# Patient Record
Sex: Male | Born: 1975 | Race: White | Hispanic: No | Marital: Married | Smoking: Never smoker
Health system: Southern US, Community
[De-identification: ages and names within clinical notes are randomized; demographics above are authoritative.]

## PROBLEM LIST (undated history)

## (undated) DIAGNOSIS — F419 Anxiety disorder, unspecified: Secondary | ICD-10-CM

## (undated) DIAGNOSIS — F319 Bipolar disorder, unspecified: Secondary | ICD-10-CM

## (undated) DIAGNOSIS — R51 Headache: Secondary | ICD-10-CM

## (undated) DIAGNOSIS — G8929 Other chronic pain: Secondary | ICD-10-CM

## (undated) DIAGNOSIS — K529 Noninfective gastroenteritis and colitis, unspecified: Secondary | ICD-10-CM

## (undated) DIAGNOSIS — B009 Herpesviral infection, unspecified: Secondary | ICD-10-CM

## (undated) DIAGNOSIS — I10 Essential (primary) hypertension: Secondary | ICD-10-CM

## (undated) DIAGNOSIS — M79675 Pain in left toe(s): Secondary | ICD-10-CM

## (undated) HISTORY — PX: WISDOM TOOTH EXTRACTION: SHX21

## (undated) HISTORY — DX: Other chronic pain: G89.29

## (undated) HISTORY — DX: Bipolar disorder, unspecified: F31.9

## (undated) HISTORY — DX: Herpesviral infection, unspecified: B00.9

## (undated) HISTORY — DX: Headache: R51

## (undated) HISTORY — DX: Anxiety disorder, unspecified: F41.9

## (undated) HISTORY — DX: Noninfective gastroenteritis and colitis, unspecified: K52.9

## (undated) HISTORY — DX: Essential (primary) hypertension: I10

## (undated) HISTORY — PX: HAMMER TOE SURGERY: SHX385

## (undated) HISTORY — DX: Pain in left toe(s): M79.675

---

## 2003-11-10 ENCOUNTER — Emergency Department (HOSPITAL_COMMUNITY): Admission: EM | Admit: 2003-11-10 | Discharge: 2003-11-10 | Payer: Self-pay | Admitting: Emergency Medicine

## 2004-04-09 ENCOUNTER — Ambulatory Visit: Payer: Self-pay | Admitting: Family Medicine

## 2005-10-13 ENCOUNTER — Ambulatory Visit: Payer: Self-pay | Admitting: Family Medicine

## 2005-12-15 ENCOUNTER — Ambulatory Visit: Payer: Self-pay | Admitting: Family Medicine

## 2006-04-06 ENCOUNTER — Ambulatory Visit: Payer: Self-pay | Admitting: Family Medicine

## 2006-05-04 ENCOUNTER — Ambulatory Visit: Payer: Self-pay | Admitting: Internal Medicine

## 2006-05-04 LAB — CONVERTED CEMR LAB: Tissue Transglutaminase Ab, IgA: <3 units (ref <5)

## 2007-03-25 ENCOUNTER — Ambulatory Visit: Payer: Self-pay | Admitting: Family Medicine

## 2007-03-25 DIAGNOSIS — R519 Headache, unspecified: Secondary | ICD-10-CM | POA: Insufficient documentation

## 2007-03-25 DIAGNOSIS — R51 Headache: Secondary | ICD-10-CM | POA: Insufficient documentation

## 2007-03-25 DIAGNOSIS — R209 Unspecified disturbances of skin sensation: Secondary | ICD-10-CM

## 2007-03-25 LAB — CONVERTED CEMR LAB
Blood in Urine, dipstick: NEGATIVE
Ketones, urine, test strip: NEGATIVE
Nitrite: NEGATIVE
Specific Gravity, Urine: 1.015
Urobilinogen, UA: 0.2
pH: 6.5

## 2007-03-26 ENCOUNTER — Telehealth: Payer: Self-pay | Admitting: Family Medicine

## 2007-03-26 LAB — CONVERTED CEMR LAB
Basophils Absolute: 0 10*3/uL (ref 0.0–0.1)
Chloride: 104 meq/L (ref 96–112)
Eosinophils Relative: 1.3 % (ref 0.0–5.0)
GFR calc Af Amer: 90 mL/min
Hemoglobin: 14.4 g/dL (ref 13.0–17.0)
MCHC: 34.2 g/dL (ref 30.0–36.0)
Monocytes Relative: 8.9 % (ref 3.0–11.0)
Neutrophils Relative %: 56 % (ref 43.0–77.0)
Platelets: 228 10*3/uL (ref 150–400)
RBC: 4.91 M/uL (ref 4.22–5.81)
RDW: 12.1 % (ref 11.5–14.6)
Sodium: 140 meq/L (ref 135–145)
WBC: 4.8 10*3/uL (ref 4.5–10.5)

## 2007-03-30 ENCOUNTER — Ambulatory Visit: Payer: Self-pay | Admitting: Family Medicine

## 2007-03-31 DIAGNOSIS — E875 Hyperkalemia: Secondary | ICD-10-CM | POA: Insufficient documentation

## 2007-03-31 LAB — CONVERTED CEMR LAB: Potassium: 5.8 meq/L — ABNORMAL HIGH (ref 3.5–5.1)

## 2007-04-12 ENCOUNTER — Telehealth: Payer: Self-pay | Admitting: Family Medicine

## 2007-04-17 ENCOUNTER — Encounter: Payer: Self-pay | Admitting: Family Medicine

## 2007-04-20 LAB — CONVERTED CEMR LAB
BUN: 13 mg/dL (ref 6–23)
CO2: 27 meq/L (ref 19–32)
Chloride: 102 meq/L (ref 96–112)
Creatinine, Ser: 1.08 mg/dL (ref 0.40–1.50)
Glucose, Bld: 96 mg/dL (ref 70–99)
Sodium: 136 meq/L (ref 135–145)

## 2007-05-10 ENCOUNTER — Telehealth: Payer: Self-pay | Admitting: Family Medicine

## 2007-07-10 ENCOUNTER — Telehealth: Payer: Self-pay | Admitting: Family Medicine

## 2007-07-10 ENCOUNTER — Ambulatory Visit: Payer: Self-pay | Admitting: Family Medicine

## 2007-07-13 ENCOUNTER — Ambulatory Visit: Payer: Self-pay | Admitting: Family Medicine

## 2007-07-21 ENCOUNTER — Telehealth: Payer: Self-pay | Admitting: Family Medicine

## 2007-12-07 ENCOUNTER — Ambulatory Visit: Payer: Self-pay | Admitting: Family Medicine

## 2007-12-07 DIAGNOSIS — M546 Pain in thoracic spine: Secondary | ICD-10-CM

## 2007-12-07 LAB — CONVERTED CEMR LAB
Bilirubin Urine: NEGATIVE
Blood in Urine, dipstick: NEGATIVE
Glucose, Urine, Semiquant: NEGATIVE
Protein, U semiquant: NEGATIVE
Specific Gravity, Urine: <1.005
Urobilinogen, UA: 0.2

## 2007-12-13 ENCOUNTER — Ambulatory Visit: Payer: Self-pay | Admitting: Cardiovascular Disease

## 2007-12-13 ENCOUNTER — Telehealth: Payer: Self-pay | Admitting: Family Medicine

## 2008-08-08 ENCOUNTER — Encounter: Admission: RE | Admit: 2008-08-08 | Discharge: 2008-08-08 | Payer: Self-pay | Admitting: Internal Medicine

## 2008-12-13 ENCOUNTER — Ambulatory Visit: Payer: Self-pay | Admitting: Family Medicine

## 2008-12-13 DIAGNOSIS — G589 Mononeuropathy, unspecified: Secondary | ICD-10-CM | POA: Insufficient documentation

## 2009-03-02 ENCOUNTER — Ambulatory Visit: Payer: Self-pay | Admitting: Family Medicine

## 2009-03-02 LAB — CONVERTED CEMR LAB
Bilirubin Urine: NEGATIVE
Blood in Urine, dipstick: NEGATIVE
Glucose, Urine, Semiquant: NEGATIVE
Nitrite: NEGATIVE
Specific Gravity, Urine: 1.02
Urobilinogen, UA: 0.2
WBC Urine, dipstick: NEGATIVE
pH: 7

## 2009-03-05 LAB — CONVERTED CEMR LAB
ALT: 21 units/L (ref 0–53)
AST: 16 units/L (ref 0–37)
Albumin: 4.3 g/dL (ref 3.5–5.2)
Chloride: 103 meq/L (ref 96–112)
Eosinophils Relative: 1.3 % (ref 0.0–5.0)
GFR calc non Af Amer: 80.71 mL/min (ref >60)
Glucose, Bld: 99 mg/dL (ref 70–99)
HCT: 45.8 % (ref 39.0–52.0)
Hemoglobin: 15.2 g/dL (ref 13.0–17.0)
LDL Cholesterol: 92 mg/dL (ref 0–99)
Lymphs Abs: 1.1 10*3/uL (ref 0.7–4.0)
MCV: 88.1 fL (ref 78.0–100.0)
Monocytes Absolute: 0.3 10*3/uL (ref 0.1–1.0)
Monocytes Relative: 8.3 % (ref 3.0–12.0)
Neutro Abs: 2.6 10*3/uL (ref 1.4–7.7)
Potassium: 5 meq/L (ref 3.5–5.1)
Sodium: 138 meq/L (ref 135–145)
TSH: 1.74 microintl units/mL (ref 0.35–5.50)
Total Protein: 7.5 g/dL (ref 6.0–8.3)
VLDL: 12.4 mg/dL (ref 0.0–40.0)
WBC: 4.1 10*3/uL — ABNORMAL LOW (ref 4.5–10.5)

## 2010-02-06 ENCOUNTER — Telehealth: Payer: Self-pay | Admitting: Family Medicine

## 2010-02-12 NOTE — Assessment & Plan Note (Signed)
Summary: CPX/PT FASTING/RCD/pt rescd//ccm   Vital Signs:  Patient profile:   35 year old male Height:      75 inches Weight:      254 pounds Temp:     98.2 degrees F oral Pulse rate:   74 / minute BP sitting:   132 / 70  (left arm) Cuff size:   large  Vitals Entered By: Alfred Levins, CMA (March 02, 2009 9:29 AM) CC: cpx, fasting   History of Present Illness: 35 yr old male for cpx. he is doing well. he only has several bad migraines a year. usually imitrex works well.   Preventive Screening-Counseling & Management  Alcohol-Tobacco     Smoking Status: never  Current Medications (verified): 1)  Vicodin 5-500 Mg  Tabs (Hydrocodone-Acetaminophen) .Marland Kitchen.. 1 Every 6 Hours As Needed For Pain 2)  Imitrex 100 Mg Tabs (Sumatriptan Succinate) .... As Needed 3)  Promethazine Hcl 25 Mg  Tabs (Promethazine Hcl) .Marland Kitchen.. 1 Every 4 Hours As Needed For Nausea  Allergies (verified): No Known Drug Allergies  Past History:  Past Medical History: Reviewed history from 03/25/2007 and no changes required. Headache IBS  Past Surgical History: Reviewed history from 03/25/2007 and no changes required. colonoscopy 09-08-03,  normal  Family History: Reviewed history from 03/25/2007 and no changes required. Family History of Arthritis Non-Hodgkins lymphoma  Social History: Reviewed history from 03/25/2007 and no changes required. Married Never Smoked Alcohol use-yes Drug use-no Regular exercise-no  Review of Systems  The patient denies anorexia, fever, weight loss, weight gain, vision loss, decreased hearing, hoarseness, chest pain, syncope, dyspnea on exertion, peripheral edema, prolonged cough, headaches, hemoptysis, abdominal pain, melena, hematochezia, severe indigestion/heartburn, hematuria, incontinence, genital sores, muscle weakness, suspicious skin lesions, transient blindness, difficulty walking, depression, unusual weight change, abnormal bleeding, enlarged lymph nodes,  angioedema, breast masses, and testicular masses.    Physical Exam  General:  Well-developed,well-nourished,in no acute distress; alert,appropriate and cooperative throughout examination Head:  Normocephalic and atraumatic without obvious abnormalities. No apparent alopecia or balding. Eyes:  No corneal or conjunctival inflammation noted. EOMI. Perrla. Funduscopic exam benign, without hemorrhages, exudates or papilledema. Vision grossly normal. Ears:  External ear exam shows no significant lesions or deformities.  Otoscopic examination reveals clear canals, tympanic membranes are intact bilaterally without bulging, retraction, inflammation or discharge. Hearing is grossly normal bilaterally. Nose:  External nasal examination shows no deformity or inflammation. Nasal mucosa are pink and moist without lesions or exudates. Mouth:  Oral mucosa and oropharynx without lesions or exudates.  Teeth in good repair. Neck:  No deformities, masses, or tenderness noted. Chest Wall:  No deformities, masses, tenderness or gynecomastia noted. Lungs:  Normal respiratory effort, chest expands symmetrically. Lungs are clear to auscultation, no crackles or wheezes. Heart:  Normal rate and regular rhythm. S1 and S2 normal without gallop, murmur, click, rub or other extra sounds. Abdomen:  Bowel sounds positive,abdomen soft and non-tender without masses, organomegaly or hernias noted. Genitalia:  Testes bilaterally descended without nodularity, tenderness or masses. No scrotal masses or lesions. No penis lesions or urethral discharge. Msk:  No deformity or scoliosis noted of thoracic or lumbar spine.   Pulses:  R and L carotid,radial,femoral,dorsalis pedis and posterior tibial pulses are full and equal bilaterally Extremities:  No clubbing, cyanosis, edema, or deformity noted with normal full range of motion of all joints.   Neurologic:  No cranial nerve deficits noted. Station and gait are normal. Plantar reflexes are  down-going bilaterally. DTRs are symmetrical throughout. Sensory, motor and  coordinative functions appear intact. Skin:  Intact without suspicious lesions or rashes Cervical Nodes:  No lymphadenopathy noted Axillary Nodes:  No palpable lymphadenopathy Inguinal Nodes:  No significant adenopathy Psych:  Cognition and judgment appear intact. Alert and cooperative with normal attention span and concentration. No apparent delusions, illusions, hallucinations   Impression & Recommendations:  Problem # 1:  HEALTH SCREENING (ICD-V70.0)  Orders: Venipuncture (82956) TLB-Lipid Panel (80061-LIPID) TLB-BMP (Basic Metabolic Panel-BMET) (80048-METABOL) TLB-CBC Platelet - w/Differential (85025-CBCD) TLB-Hepatic/Liver Function Pnl (80076-HEPATIC) TLB-TSH (Thyroid Stimulating Hormone) (84443-TSH) UA Dipstick w/o Micro (manual) (21308)  Complete Medication List: 1)  Vicodin 5-500 Mg Tabs (Hydrocodone-acetaminophen) .Marland Kitchen.. 1 every 6 hours as needed for pain 2)  Imitrex 100 Mg Tabs (Sumatriptan succinate) .... As needed 3)  Promethazine Hcl 25 Mg Tabs (Promethazine hcl) .Marland Kitchen.. 1 every 4 hours as needed for nausea  Patient Instructions: 1)  Please schedule a follow-up appointment in 1 year.  Prescriptions: PROMETHAZINE HCL 25 MG  TABS (PROMETHAZINE HCL) 1 every 4 hours as needed for nausea  #60 x 2   Entered and Authorized by:   Nelwyn Salisbury MD   Signed by:   Nelwyn Salisbury MD on 03/02/2009   Method used:   Print then Give to Patient   RxID:   [id number] IMITREX 100 MG TABS (SUMATRIPTAN SUCCINATE) as needed  #12 x 11   Entered and Authorized by:   Nelwyn Salisbury MD   Signed by:   Nelwyn Salisbury MD on 03/02/2009   Method used:   Print then Give to Patient   RxID:   [id number] VICODIN 5-500 MG  TABS (HYDROCODONE-ACETAMINOPHEN) 1 every 6 hours as needed for pain  #60 x 2   Entered and Authorized by:   Nelwyn Salisbury MD   Signed by:   Nelwyn Salisbury MD on 03/02/2009   Method used:   Print then  Give to Patient   RxID:   [id number]   Laboratory Results   Urine Tests  Date/Time Recieved: March 02, 2009 11:57 AM  Date/Time Reported: March 02, 2009 11:57 AM   Routine Urinalysis   Color: yellow Appearance: Clear Glucose: negative   (Normal Range: Negative) Bilirubin: negative   (Normal Range: Negative) Ketone: negative   (Normal Range: Negative) Spec. Gravity: 1.020   (Normal Range: 1.003-1.035) Blood: negative   (Normal Range: Negative) pH: 7.0   (Normal Range: 5.0-8.0) Protein: negative   (Normal Range: Negative) Urobilinogen: 0.2   (Normal Range: 0-1) Nitrite: negative   (Normal Range: Negative) Leukocyte Esterace: negative   (Normal Range: Negative)    Comments: Wynona Canes, CMA  March 02, 2009 11:57 AM

## 2010-02-14 NOTE — Progress Notes (Signed)
Summary: refills  Phone Note Call from Patient   Caller: Spouse Call For: Nelwyn Salisbury MD Summary of Call: Wife states he needs a refill of Imitrex, Phenergan, and Vicodin. Appt for CPX in 03/[phone]  ASAP, please do this TONIGHT [facility] [address] Initial call taken by: [facility] CMA AAMA,  February 06, 2010 2:02 PM  Follow-up for Phone Call        call in Imitrex #12 with 2 rf, Vicodin #60 with no rf, and Phenergan #60 with no rf Follow-up by: Nelwyn Salisbury MD,  February 06, 2010 4:26 PM  Additional Follow-up for Phone Call Additional follow up Details #1::        pt aware.  Additional Follow-up by: Pura Spice, RN,  February 06, 2010 5:17 PM    Prescriptions: PROMETHAZINE HCL 25 MG  TABS (PROMETHAZINE HCL) 1 every 4 hours as needed for nausea  #60 x 0   Entered by:   Pura Spice, RN   Authorized by:   Nelwyn Salisbury MD   Signed by:   Pura Spice, RN on 02/06/2010   Method used:   Telephoned to ...       [facility] [address] # [phone]* (retail)       [address] [place], [place]  [address]       Ph: [phone]       Fax: [phone]   RxID:   [id number] IMITREX 100 MG TABS (SUMATRIPTAN SUCCINATE) as needed  #12 x 0   Entered by:   Pura Spice, RN   Authorized by:   Nelwyn Salisbury MD   Signed by:   Pura Spice, RN on 02/06/2010   Method used:   Telephoned to ...       [facility] [address] # [phone]* (retail)       [address] [place], [place]  [address]       Ph: [phone]       Fax: [phone]   RxID:   [id number] VICODIN 5-500 MG  TABS (HYDROCODONE-ACETAMINOPHEN) 1 every 6 hours as needed for pain  #60 x 0   Entered by:   Pura Spice, RN   Authorized by:   Nelwyn Salisbury MD   Signed by:   Pura Spice, RN on 02/06/2010   Method used:   Telephoned to ...       [facility] [address] [facility] [address]* (retail)       [address] [place], [place]  [address]       Ph: [phone]       Fax: [phone]   RxID:    [id number]

## 2010-03-05 ENCOUNTER — Encounter: Payer: Self-pay | Admitting: Family Medicine

## 2010-03-07 ENCOUNTER — Encounter: Payer: Self-pay | Admitting: Family Medicine

## 2010-03-07 ENCOUNTER — Ambulatory Visit (INDEPENDENT_AMBULATORY_CARE_PROVIDER_SITE_OTHER): Payer: BC Managed Care – PPO | Admitting: Family Medicine

## 2010-03-07 VITALS — BP 120/86 | HR 87 | Wt 249.0 lb

## 2010-03-07 DIAGNOSIS — G4700 Insomnia, unspecified: Secondary | ICD-10-CM

## 2010-03-07 DIAGNOSIS — F419 Anxiety disorder, unspecified: Secondary | ICD-10-CM

## 2010-03-07 DIAGNOSIS — F411 Generalized anxiety disorder: Secondary | ICD-10-CM

## 2010-03-07 MED ORDER — LORAZEPAM 1 MG PO TABS: 1.0000 mg | ORAL_TABLET | Freq: Every evening | ORAL | Status: AC | PRN

## 2010-03-07 NOTE — Progress Notes (Signed)
  Subjective:    Patient ID: Samuel Harrell, male    DOB: [birth year], 35 y.o.   MRN: [id number]  HPI Here with his wife to discuss some episodes of anxiety which wake him up from sleep in the middle of the night. He has been under some stress lately, and when a close friend recently died at age 39, he has been very worried about his health. He wakes up once or twice a week "feeling funny" in his chest, but he cannot describe this with any more detail. No chest pain or palpitations or SOB. No heartburn or GERD symptoms. When he wakes up he lies in bed worrying that something is wrong with him. He denies any anxiety during the day, no depression.    Review of Systems  Constitutional: Negative.   Respiratory: Negative.   Cardiovascular: Negative.   Gastrointestinal: Negative.   Psychiatric/Behavioral: Positive for sleep disturbance. Negative for dysphoric mood and agitation. The patient is nervous/anxious.        Objective:   Physical Exam  Constitutional: He appears well-developed and well-nourished. No distress.  Cardiovascular: Normal rate, regular rhythm, normal heart sounds and intact distal pulses.  Exam reveals no gallop and no friction rub.   No murmur heard. Pulmonary/Chest: Effort normal and breath sounds normal. No respiratory distress. He has no wheezes. He has no rales. He exhibits no tenderness.  Psychiatric: He has a normal mood and affect. His behavior is normal. Judgment and thought content normal.          Assessment & Plan:  These seem to be a a manifestation of anxiety. I reassured him that he is fine physically and that he has no risk factors for heart disease. We will try a mild medication at bedtime for anxiety for a month or so, and go from there.

## 2010-04-09 ENCOUNTER — Encounter: Payer: Self-pay | Admitting: Family Medicine

## 2010-04-30 ENCOUNTER — Ambulatory Visit (INDEPENDENT_AMBULATORY_CARE_PROVIDER_SITE_OTHER): Payer: BC Managed Care – PPO | Admitting: Family Medicine

## 2010-04-30 ENCOUNTER — Encounter: Payer: Self-pay | Admitting: Family Medicine

## 2010-04-30 VITALS — BP 120/86 | HR 100 | Temp 98.7°F | Wt 244.0 lb

## 2010-04-30 DIAGNOSIS — F411 Generalized anxiety disorder: Secondary | ICD-10-CM

## 2010-04-30 DIAGNOSIS — K219 Gastro-esophageal reflux disease without esophagitis: Secondary | ICD-10-CM

## 2010-04-30 DIAGNOSIS — F419 Anxiety disorder, unspecified: Secondary | ICD-10-CM

## 2010-04-30 MED ORDER — LORAZEPAM 1 MG PO TABS: 1.0000 mg | ORAL_TABLET | Freq: Every evening | ORAL | Status: DC | PRN

## 2010-04-30 MED ORDER — OMEPRAZOLE 40 MG PO CPDR: 40.0000 mg | DELAYED_RELEASE_CAPSULE | Freq: Every day | ORAL | Status: DC

## 2010-04-30 NOTE — Progress Notes (Signed)
  Subjective:    Patient ID: Samuel Harrell, male    DOB: [birth year], 35 y.o.   MRN: [id number]  HPI Here for several weeks of waking up at night with chest discomfort, SOB, and lots of anxiety. He still takes one Ativan at bedtime every night. He has had some heartburn and belching lately, especially when eating spicy foods like pizza. He had a lot of this over the past weekend, and Pepto-Bismol helped it. No nausea. BMs are regular.    Review of Systems  Constitutional: Negative.   Respiratory: Positive for chest tightness and shortness of breath. Negative for cough and wheezing.   Cardiovascular: Positive for chest pain. Negative for palpitations and leg swelling.  Gastrointestinal: Negative.   Psychiatric/Behavioral: Positive for sleep disturbance. Negative for dysphoric mood and agitation. The patient is nervous/anxious.        Objective:   Physical Exam  Constitutional: He appears well-developed and well-nourished.  Cardiovascular: Normal rate, regular rhythm, normal heart sounds and intact distal pulses.   Pulmonary/Chest: Effort normal and breath sounds normal.  Abdominal: Soft. Bowel sounds are normal. He exhibits no distension and no mass. There is no tenderness. There is no rebound and no guarding.  Psychiatric: He has a normal mood and affect. His behavior is normal. Judgment and thought content normal.          Assessment & Plan:  He has classic nocturnal GERD symptoms, and these are causing an anxiety reaction in the middle of the night. Avoid large meals and spicy foods late at night. Start on Omeprazole daily. Continue bedtime Ativan.

## 2010-05-31 NOTE — Assessment & Plan Note (Signed)
[facility]                         GASTROENTEROLOGY OFFICE NOTE   NAME:Samuel Harrell, Thon                       MRN:          [id number]  DATE:05/04/2006                            DOB:          05-29-1975    REFERRING PHYSICIAN:  Tera Mater. Clent Ridges, MD   OFFICE CONSULTATION NOTE:   REASON FOR CONSULTATION:  Stomach problems.   HISTORY:  This is a 35 year old white male with presumed chronic  irritable bowel syndrome who was referred through the courtesy of Dr.  Clent Ridges regarding abdominal complaints.  The patient reports a many-year  history of problems with intermittent urgency and diarrhea, often  exacerbated by meals.  This alternating with constipation and bloating.  Diarrhea seems to predominate.  For diarrhea, he will take Imodium.  For  constipation, he will take fiber.  The patient's mother has a history of  ulcerative colitis.  The patient, himself, was sent directly for  colonoscopy on September 08, 2003 to evaluate chronic diarrhea and  hematochezia.  The examination, including intubation of the ileum, was  entirely normal.  He presents at this time after reporting 2 episodes of  flu-like symptoms followed by several days of severe diarrhea.  He  occasionally has some minor blood on the tissue which has been  attributed to a fissure.  He has no problems with his appetite.  His  weight is stable.  No nocturnal symptoms.  No melena, fevers or other  symptoms.  He also has occasional heartburn for which he takes Tums.  The patient denies any extraintestinal problems such as skin rash, joint  aches or visual disturbance.   PAST MEDICAL HISTORY:  None.   PAST SURGICAL HISTORY:  None.   ALLERGIES:  None.   CURRENT MEDICATIONS:  None.   FAMILY HISTORY:  Mother with ulcerative colitis.   SOCIAL HISTORY:  The patient is married with 1 daughter.  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  He does not smoke.  Rarely uses  alcohol.   REVIEW OF  SYSTEMS:  Per diagnostic evaluation form.   PHYSICAL EXAMINATION:  GENERAL:  A well-appearing male in no acute  distress.  VITAL SIGNS:  Blood pressure is 144/74, heart rate 76.  Weight is 234  pounds.  He is 6 feet, 4 inches in height.  HEENT:  Sclerae anicteric.  Conjunctivae are pink.  Oral mucosa intact.  No adenopathy.  LUNGS:  Clear.  HEART:  Regular.  ABDOMEN:  Soft without tenderness, mass or hernia.  Good bowel sounds  heard.  RECTAL:  Omitted.  EXTREMITIES:  Without edema.   IMPRESSION:  Chronic abdominal complaints, most consistent with  irritable bowel syndrome.  No worrisome features by history.  Negative  colonoscopy in 2005.   RECOMMENDATIONS:  1. Literature provided on irritable bowel syndrome.  2. Obtain tissue transglutaminase antibody to screen for occult sprue.  3. Provide Lomotil for episodes of severe diarrhea.  The patient      requests something stronger than Imodium.  4. GI follow up p.r.n.  Ongoing general medical care with Dr. Clent Ridges.  Wilhemina Bonito. Marina Goodell, MD  Electronically Signed    JNP/MedQ  DD: 05/04/2006  DT: 05/05/2006  Job #: [id number]   cc:   Jeannett Senior A. Clent Ridges, MD

## 2010-05-31 NOTE — Assessment & Plan Note (Signed)
[facility] OFFICE NOTE   NAME:Bevans, Samuel Harrell                       MRN:          [id number]  DATE:10/13/2005                            DOB:          February 20, 1975    This is a 35 year old gentleman here for a complete physical examination.  He feels fine and has no complaints at all.  We have diagnosed him with  irritable bowel syndrome in the past.  He occasionally takes Imodium AD for  that but primarily he does quite well simply by managing his diet.  His  migraine headaches are quite stable.  In fact, he only averages one or two a  year.  For other details of his past medical history, family history, social  history, habits, etc., refer to our last physical note dated February 21, 2002.   ALLERGIES:  None.   CURRENT MEDICATIONS:  1. Imitrex 100 mg tablets as needed.  2. Imodium AD as needed.   OBJECTIVE:  Height 6 foot 4 inches.  Weight 236.  BP 132/94 (on recheck it  was down to 126/88), pulse 76 and regular.  GENERAL:  He appears to be quite healthy.  SKIN:  Clear.  EYES:  Clear.  EARS:  Clear.  PHARYNX:  Clear.  NECK:  Supple without lymphadenopathy, masses.  LUNGS:  Clear.  CARDIAC:  Rate and rhythm regular without gallops, murmurs or rubs.  Distal  pulses are full.  ABDOMEN:  Soft.  Normal bowel sounds.  Nontender.  No masses.  EXTREMITIES:  No clubbing, cyanosis, or edema.  NEUROLOGICAL:  Grossly intact.  GENITALIA:  Normal male.  EXTREMITIES:  No clubbing, cyanosis, or edema.   ASSESSMENT/PLAN:  1. Complete physical:  He is fasting so we will get the usual      laboratories.  2. Irritable bowel syndrome, stable.  3. Migraine headaches, stable.            ______________________________  Tera Mater. Clent Ridges, MD   SAF/MedQ  DD:  10/14/2005  DT:  10/16/2005  Job #:  [id number]

## 2010-06-16 ENCOUNTER — Other Ambulatory Visit: Payer: Self-pay | Admitting: Family Medicine

## 2010-06-17 NOTE — Telephone Encounter (Signed)
Call in #30 with 5 rf 

## 2010-06-17 NOTE — Telephone Encounter (Signed)
Rx call into [facility] pharmacist [place]

## 2010-06-20 ENCOUNTER — Telehealth: Payer: Self-pay | Admitting: *Deleted

## 2010-06-20 NOTE — Telephone Encounter (Signed)
Call in #30 with 5 rf 

## 2010-06-20 NOTE — Telephone Encounter (Signed)
Refill on lorazepam 1 mg

## 2010-06-21 MED ORDER — LORAZEPAM 1 MG PO TABS: 1.0000 mg | ORAL_TABLET | Freq: Every day | ORAL | Status: DC

## 2010-06-21 NOTE — Telephone Encounter (Signed)
rx called in

## 2010-08-07 ENCOUNTER — Telehealth: Payer: Self-pay

## 2010-08-07 NOTE — Telephone Encounter (Signed)
I called and left message. I need to make sure that pt is requesting this and then Dr. Clent Ridges said he would make the referral.

## 2010-08-07 NOTE — Telephone Encounter (Signed)
Pt's wife would like to discuss urology referral for pt to get a vasectomy

## 2010-10-03 ENCOUNTER — Ambulatory Visit (INDEPENDENT_AMBULATORY_CARE_PROVIDER_SITE_OTHER): Payer: BC Managed Care – PPO | Admitting: Internal Medicine

## 2010-10-03 ENCOUNTER — Encounter: Payer: Self-pay | Admitting: Internal Medicine

## 2010-10-03 VITALS — BP 160/90 | Temp 98.1°F | Wt 250.0 lb

## 2010-10-03 DIAGNOSIS — Z23 Encounter for immunization: Secondary | ICD-10-CM

## 2010-10-03 DIAGNOSIS — R51 Headache: Secondary | ICD-10-CM

## 2010-10-03 DIAGNOSIS — Z0000 Encounter for general adult medical examination without abnormal findings: Secondary | ICD-10-CM

## 2010-10-03 DIAGNOSIS — R42 Dizziness and giddiness: Secondary | ICD-10-CM

## 2010-10-03 NOTE — Progress Notes (Signed)
  Subjective:    Patient ID: Samuel Harrell, male    DOB: 28-Feb-1975, 35 y.o.   MRN: [id number]  HPI  35 year old patient who presents with a one-week history of intermittent lightheadedness. This has worsened in the past few days he describes a sense of being in lightheaded but does seem to be aggravated by movement denies true vertigo. Symptoms are intermittent and describes as a lightheadedness and fullness in the head area for the past couple days she has had some upper back and neck discomfort. No URI or allergy symptoms. Symptoms have been worse over the past 6 days. He does have a history of migraine headaches which have been stable;  he has been on lorazepam to assist with sleep which he stopped several days ago due to concerns of a possible drug side effect    Review of Systems  Constitutional: Negative for fever, chills, appetite change and fatigue.  HENT: Negative for hearing loss, ear pain, congestion, sore throat, trouble swallowing, neck stiffness, dental problem, voice change and tinnitus.   Eyes: Negative for pain, discharge and visual disturbance.  Respiratory: Negative for cough, chest tightness, wheezing and stridor.   Cardiovascular: Negative for chest pain, palpitations and leg swelling.  Gastrointestinal: Negative for nausea, vomiting, abdominal pain, diarrhea, constipation, blood in stool and abdominal distention.  Genitourinary: Negative for urgency, hematuria, flank pain, discharge, difficulty urinating and genital sores.  Musculoskeletal: Negative for myalgias, back pain, joint swelling, arthralgias and gait problem.  Skin: Negative for rash.  Neurological: Positive for light-headedness. Negative for dizziness, syncope, speech difficulty, weakness, numbness and headaches.  Hematological: Negative for adenopathy. Does not bruise/bleed easily.  Psychiatric/Behavioral: Negative for behavioral problems and dysphoric mood. The patient is not nervous/anxious.          Objective:   Physical Exam  Constitutional: He is oriented to person, place, and time. He appears well-developed.  HENT:  Head: Normocephalic.  Right Ear: External ear normal.  Left Ear: External ear normal.       Slight dullness of the left tympanic membrane  Eyes: Conjunctivae and EOM are normal.  Neck: Normal range of motion.  Cardiovascular: Normal rate and normal heart sounds.   Pulmonary/Chest: Breath sounds normal.  Abdominal: Bowel sounds are normal.  Musculoskeletal: Normal range of motion. He exhibits no edema and no tenderness.  Neurological: He is alert and oriented to person, place, and time.  Psychiatric: He has a normal mood and affect. His behavior is normal.          Assessment & Plan:   Nonspecific lightheadedness. Does not sound like true vertigo although symptoms are aggravated by movement of the head. He has been asked resume his low lorazepam at bedtime. He was also asked to use they wanted a nonsedating antihistamine. He will call if unimproved Migraine headaches stable

## 2010-10-03 NOTE — Patient Instructions (Signed)
Use  a once a day  nonsedating antihistamines such as Alavert or Allegra  Call or return to clinic prn if these symptoms worsen or fail to improve as anticipated.

## 2011-01-05 ENCOUNTER — Other Ambulatory Visit: Payer: Self-pay | Admitting: Family Medicine

## 2011-02-05 ENCOUNTER — Encounter: Payer: Self-pay | Admitting: Family Medicine

## 2011-02-05 ENCOUNTER — Ambulatory Visit (INDEPENDENT_AMBULATORY_CARE_PROVIDER_SITE_OTHER): Payer: BC Managed Care – PPO | Admitting: Family Medicine

## 2011-02-05 VITALS — BP 126/84 | HR 92 | Temp 98.4°F | Wt 252.0 lb

## 2011-02-05 DIAGNOSIS — Z309 Encounter for contraceptive management, unspecified: Secondary | ICD-10-CM

## 2011-02-05 DIAGNOSIS — Z0000 Encounter for general adult medical examination without abnormal findings: Secondary | ICD-10-CM

## 2011-02-05 DIAGNOSIS — Z23 Encounter for immunization: Secondary | ICD-10-CM

## 2011-02-05 DIAGNOSIS — F419 Anxiety disorder, unspecified: Secondary | ICD-10-CM

## 2011-02-05 LAB — CBC WITH DIFFERENTIAL/PLATELET
Basophils Absolute: 0 10*3/uL (ref 0.0–0.1)
Basophils Relative: 0.4 % (ref 0.0–3.0)
Eosinophils Absolute: 0 10*3/uL (ref 0.0–0.7)
Hemoglobin: 16.6 g/dL (ref 13.0–17.0)
Lymphocytes Relative: 25.6 % (ref 12.0–46.0)
Lymphs Abs: 1.4 10*3/uL (ref 0.7–4.0)
MCHC: 34.6 g/dL (ref 30.0–36.0)
MCV: 86.4 fl (ref 78.0–100.0)
Monocytes Absolute: 0.4 10*3/uL (ref 0.1–1.0)
Neutro Abs: 3.7 10*3/uL (ref 1.4–7.7)
RDW: 12.8 % (ref 11.5–14.6)

## 2011-02-05 LAB — HEPATIC FUNCTION PANEL
ALT: 33 U/L (ref 0–53)
AST: 25 U/L (ref 0–37)
Albumin: 4.8 g/dL (ref 3.5–5.2)
Alkaline Phosphatase: 66 U/L (ref 39–117)
Total Bilirubin: 0.9 mg/dL (ref 0.3–1.2)

## 2011-02-05 LAB — POCT URINALYSIS DIPSTICK
Blood, UA: NEGATIVE
Ketones, UA: NEGATIVE
Leukocytes, UA: NEGATIVE
Protein, UA: NEGATIVE
pH, UA: 7

## 2011-02-05 LAB — BASIC METABOLIC PANEL
BUN: 13 mg/dL (ref 6–23)
Chloride: 102 mEq/L (ref 96–112)
Glucose, Bld: 93 mg/dL (ref 70–99)
Potassium: 5.1 mEq/L (ref 3.5–5.1)

## 2011-02-05 LAB — LIPID PANEL: Cholesterol: 181 mg/dL (ref 0–200)

## 2011-02-05 MED ORDER — LORAZEPAM 1 MG PO TABS: 1.0000 mg | ORAL_TABLET | Freq: Every day | ORAL | Status: DC

## 2011-02-05 NOTE — Progress Notes (Signed)
  Subjective:    Patient ID: Samuel Harrell, male    DOB: 28-Oct-1975, 36 y.o.   MRN: [id number]  HPI Here for refills and other issues. He would like a flu shot. He needs a referral for a vasectomy. His Lorazepam works well for sleep. He is fasting for labs.    Review of Systems  Constitutional: Negative.   Respiratory: Negative.   Cardiovascular: Negative.        Objective:   Physical Exam  Constitutional: He appears well-developed and well-nourished.  Cardiovascular: Normal rate, regular rhythm, normal heart sounds and intact distal pulses.   Pulmonary/Chest: Effort normal and breath sounds normal.  Lymphadenopathy:    He has no cervical adenopathy.  Psychiatric: He has a normal mood and affect. His behavior is normal. Thought content normal.          Assessment & Plan:  Refilled Ativan. Get labs. Refer to Urology.

## 2011-02-05 NOTE — Progress Notes (Signed)
Addended by: Aniceto Boss A on: 02/05/2011 12:19 PM   Modules accepted: Orders

## 2011-02-12 ENCOUNTER — Encounter: Payer: Self-pay | Admitting: Family Medicine

## 2011-02-12 NOTE — Progress Notes (Signed)
Quick Note:  Spoke with pt and put a copy of results in mail. ______ 

## 2012-07-20 ENCOUNTER — Telehealth: Payer: Self-pay | Admitting: Family Medicine

## 2012-07-20 NOTE — Telephone Encounter (Signed)
Pt needs refills on phenergan 25 mg,imitrex 100 mg and hydrocodone 5-325mg . Pt last had these meds refill  in 2012. Pt is having a migraine and needs the meds today. [facility] [place] [phone]

## 2012-07-21 NOTE — Telephone Encounter (Signed)
Pt wife will inform her hus he needs to make appt

## 2012-07-21 NOTE — Telephone Encounter (Signed)
He needs an OV for this  

## 2012-08-12 ENCOUNTER — Ambulatory Visit (INDEPENDENT_AMBULATORY_CARE_PROVIDER_SITE_OTHER): Payer: BC Managed Care – PPO | Admitting: Family Medicine

## 2012-08-12 ENCOUNTER — Encounter: Payer: Self-pay | Admitting: Family Medicine

## 2012-08-12 VITALS — BP 124/78 | HR 85 | Temp 97.8°F | Wt 258.0 lb

## 2012-08-12 DIAGNOSIS — R51 Headache: Secondary | ICD-10-CM

## 2012-08-12 LAB — LIPID PANEL
Cholesterol: 172 mg/dL (ref 0–200)
LDL Cholesterol: 116 mg/dL — ABNORMAL HIGH (ref 0–99)
Triglycerides: 76 mg/dL (ref 0.0–149.0)

## 2012-08-12 LAB — HEPATIC FUNCTION PANEL
ALT: 25 U/L (ref 0–53)
AST: 20 U/L (ref 0–37)
Total Bilirubin: 0.8 mg/dL (ref 0.3–1.2)
Total Protein: 7.5 g/dL (ref 6.0–8.3)

## 2012-08-12 LAB — CBC WITH DIFFERENTIAL/PLATELET
Basophils Absolute: 0 10*3/uL (ref 0.0–0.1)
Eosinophils Absolute: 0.1 10*3/uL (ref 0.0–0.7)
HCT: 45.2 % (ref 39.0–52.0)
Hemoglobin: 15.2 g/dL (ref 13.0–17.0)
Lymphocytes Relative: 31 % (ref 12.0–46.0)
Lymphs Abs: 1.8 10*3/uL (ref 0.7–4.0)
MCHC: 33.7 g/dL (ref 30.0–36.0)
Neutro Abs: 3.5 10*3/uL (ref 1.4–7.7)
Platelets: 243 10*3/uL (ref 150.0–400.0)
RDW: 12.5 % (ref 11.5–14.6)

## 2012-08-12 LAB — BASIC METABOLIC PANEL
BUN: 15 mg/dL (ref 6–23)
CO2: 28 mEq/L (ref 19–32)
Chloride: 104 mEq/L (ref 96–112)
Creatinine, Ser: 1 mg/dL (ref 0.4–1.5)
Glucose, Bld: 87 mg/dL (ref 70–99)
Potassium: 4.2 mEq/L (ref 3.5–5.1)

## 2012-08-12 LAB — POCT URINALYSIS DIPSTICK
Ketones, UA: NEGATIVE
Leukocytes, UA: NEGATIVE
Protein, UA: NEGATIVE
Urobilinogen, UA: 0.2
pH, UA: 7

## 2012-08-12 MED ORDER — HYDROCODONE-ACETAMINOPHEN 5-500 MG PO TABS: 1.0000 | ORAL_TABLET | Freq: Four times a day (QID) | ORAL | Status: DC | PRN

## 2012-08-12 MED ORDER — PROMETHAZINE HCL 25 MG PO TABS: 25.0000 mg | ORAL_TABLET | ORAL | Status: DC | PRN

## 2012-08-12 MED ORDER — SUMATRIPTAN SUCCINATE 100 MG PO TABS: 100.0000 mg | ORAL_TABLET | ORAL | Status: DC | PRN

## 2012-08-12 NOTE — Progress Notes (Signed)
  Subjective:    Patient ID: Samuel Harrell, male    DOB: [birth year], 37 y.o.   MRN: [id number]  HPI Here to follow up. He is doing well. He averages 3 or 4 migraines a year, and the meds work well for him.    Review of Systems  Constitutional: Negative.   Respiratory: Negative.   Cardiovascular: Negative.   Neurological: Negative.        Objective:   Physical Exam  Constitutional: He is oriented to person, place, and time. He appears well-developed and well-nourished.  Cardiovascular: Normal rate, regular rhythm, normal heart sounds and intact distal pulses.   Pulmonary/Chest: Effort normal and breath sounds normal.  Neurological: He is alert and oriented to person, place, and time.          Assessment & Plan:  Refilled meds. Get labs.

## 2012-08-13 NOTE — Progress Notes (Signed)
Quick Note:  I left voice message with results. ______ 

## 2013-03-12 ENCOUNTER — Encounter (HOSPITAL_BASED_OUTPATIENT_CLINIC_OR_DEPARTMENT_OTHER): Payer: Self-pay | Admitting: Emergency Medicine

## 2013-03-12 ENCOUNTER — Emergency Department (HOSPITAL_BASED_OUTPATIENT_CLINIC_OR_DEPARTMENT_OTHER)
Admission: EM | Admit: 2013-03-12 | Discharge: 2013-03-12 | Disposition: A | Discharge status: Home / Self Care | Payer: BC Managed Care – PPO | Attending: Emergency Medicine | Admitting: Emergency Medicine

## 2013-03-12 DIAGNOSIS — Z8719 Personal history of other diseases of the digestive system: Secondary | ICD-10-CM | POA: Insufficient documentation

## 2013-03-12 DIAGNOSIS — IMO0001 Reserved for inherently not codable concepts without codable children: Secondary | ICD-10-CM | POA: Insufficient documentation

## 2013-03-12 DIAGNOSIS — Z79899 Other long term (current) drug therapy: Secondary | ICD-10-CM | POA: Insufficient documentation

## 2013-03-12 DIAGNOSIS — B349 Viral infection, unspecified: Secondary | ICD-10-CM

## 2013-03-12 DIAGNOSIS — B9789 Other viral agents as the cause of diseases classified elsewhere: Secondary | ICD-10-CM | POA: Insufficient documentation

## 2013-03-12 MED ORDER — KETOROLAC TROMETHAMINE 30 MG/ML IJ SOLN
30.0000 mg | Freq: Once | INTRAMUSCULAR | Status: AC
  Administered 2013-03-12: 30 mg via INTRAVENOUS

## 2013-03-12 MED ORDER — DICYCLOMINE HCL 10 MG/ML IM SOLN
20.0000 mg | Freq: Once | INTRAMUSCULAR | Status: AC
  Administered 2013-03-12: 20 mg via INTRAMUSCULAR
  Filled 2013-03-12: qty 2

## 2013-03-12 MED ORDER — KETOROLAC TROMETHAMINE 30 MG/ML IJ SOLN
INTRAMUSCULAR | Status: AC
  Administered 2013-03-12: 30 mg via INTRAVENOUS
  Filled 2013-03-12: qty 1

## 2013-03-12 NOTE — ED Notes (Signed)
Pt with diarrhea x 3 days, seen today at pcp, given lomitil, now developed n/v and diarrhea has not improved, feels achy, denies fever

## 2013-03-12 NOTE — ED Provider Notes (Signed)
CSN: [id number]     Arrival date & time 03/12/13  0200 History   First MD Initiated Contact with Patient 03/12/13 0221     Chief Complaint  Patient presents with  . Diarrhea     (Consider location/radiation/quality/duration/timing/severity/associated sxs/prior Treatment) Patient is a 38 y.o. male presenting with diarrhea. The history is provided by the patient and the spouse.  Diarrhea Quality:  Watery Severity:  Moderate Onset quality:  Gradual Duration:  2 days Timing:  Intermittent Progression:  Unchanged Relieved by:  Anti-motility medications Worsened by:  Nothing tried Associated symptoms: myalgias   Associated symptoms: no abdominal pain and no fever   Myalgias:    Location:  Generalized   Quality:  Aching   Severity:  Moderate   Onset quality:  Gradual   Timing:  Constant   Progression:  Unable to specify Risk factors: sick contacts   Risk factors: no recent antibiotic use     Past Medical History  Diagnosis Date  . Headache(784.0)   . IBS (irritable bowel syndrome)    History reviewed. No pertinent past surgical history. Family History  Problem Relation Age of Onset  . Arthritis      family hx   History  Substance Use Topics  . Smoking status: Never Smoker   . Smokeless tobacco: Never Used  . Alcohol Use: Yes     Comment: rare    Review of Systems  Constitutional: Negative for fever.  Gastrointestinal: Positive for diarrhea. Negative for abdominal pain.  Musculoskeletal: Positive for myalgias.  All other systems reviewed and are negative.      Allergies  Review of patient's allergies indicates no known allergies.  Home Medications   Current Outpatient Rx  Name  Route  Sig  Dispense  Refill  . diphenoxylate-atropine (LOMOTIL) 2.5-0.025 MG per tablet   Oral   Take by mouth 4 (four) times daily as needed for diarrhea or loose stools.         Marland Kitchen ibuprofen (ADVIL,MOTRIN) 200 MG tablet   Oral   Take 200 mg by mouth every 6 (six) hours as  needed.         Marland Kitchen HYDROcodone-acetaminophen (VICODIN) 5-500 MG per tablet   Oral   Take 1 tablet by mouth every 6 (six) hours as needed for pain.   30 tablet   5   . promethazine (PHENERGAN) 25 MG tablet   Oral   Take 1 tablet (25 mg total) by mouth every 4 (four) hours as needed for nausea.   30 tablet   11   . SUMAtriptan (IMITREX) 100 MG tablet   Oral   Take 1 tablet (100 mg total) by mouth as needed for migraine.   10 tablet   11    BP 130/76  Pulse 78  Temp(Src) 98.8 F (37.1 C) (Oral)  Resp 18  SpO2 95% Physical Exam  Constitutional: He is oriented to person, place, and time. He appears well-developed and well-nourished. No distress.  HENT:  Head: Normocephalic and atraumatic.  Mouth/Throat: Oropharynx is clear and moist.  Eyes: Conjunctivae are normal. Pupils are equal, round, and reactive to light.  Neck: Normal range of motion. Neck supple.  Cardiovascular: Normal rate, regular rhythm and intact distal pulses.   Pulmonary/Chest: Effort normal and breath sounds normal. He has no wheezes. He has no rales.  Abdominal: Soft. Bowel sounds are increased. There is no tenderness. There is no rebound and no guarding.  Musculoskeletal: Normal range of motion.  Neurological: He is alert  and oriented to person, place, and time.  Skin: Skin is warm and dry.  Psychiatric: He has a normal mood and affect.    ED Course  Procedures (including critical care time) Labs Review Labs Reviewed - No data to display Imaging Review No results found.   EKG Interpretation None      MDM   Final diagnoses:  None   Well appearing No indication for labs or imaging.  Symptoms are consistent with viral illness that other family members currently have.    Medications  ketorolac (TORADOL) 30 MG/ML injection 30 mg (30 mg Intravenous Given 03/12/13 0251)  dicyclomine (BENTYL) injection 20 mg (20 mg Intramuscular Given 03/12/13 0251)   IVF given.     Will give bland diet  instructions increase liquids. Probiotics in the form of supplements or kefir.  Ibuprofen for aches and pains     Gionna Polak K Jareth Pardee-Rasch, MD 03/12/13 [phone]

## 2014-05-08 ENCOUNTER — Ambulatory Visit (INDEPENDENT_AMBULATORY_CARE_PROVIDER_SITE_OTHER): Payer: BLUE CROSS/BLUE SHIELD | Admitting: Family Medicine

## 2014-05-08 ENCOUNTER — Encounter: Payer: Self-pay | Admitting: Family Medicine

## 2014-05-08 VITALS — BP 136/80 | Temp 98.1°F | Ht 75.25 in | Wt 270.1 lb

## 2014-05-08 DIAGNOSIS — J209 Acute bronchitis, unspecified: Secondary | ICD-10-CM | POA: Diagnosis not present

## 2014-05-08 MED ORDER — PROMETHAZINE HCL 25 MG PO TABS: 25.0000 mg | ORAL_TABLET | ORAL | Status: DC | PRN

## 2014-05-08 MED ORDER — SUMATRIPTAN SUCCINATE 100 MG PO TABS: 100.0000 mg | ORAL_TABLET | ORAL | Status: DC | PRN

## 2014-05-08 MED ORDER — HYDROCODONE-ACETAMINOPHEN 5-500 MG PO TABS: 1.0000 | ORAL_TABLET | Freq: Four times a day (QID) | ORAL | Status: DC | PRN

## 2014-05-08 MED ORDER — HYDROCODONE-HOMATROPINE 5-1.5 MG/5ML PO SYRP: 5.0000 mL | ORAL_SOLUTION | ORAL | Status: DC | PRN

## 2014-05-08 MED ORDER — AZITHROMYCIN 250 MG PO TABS: ORAL_TABLET | ORAL | Status: DC

## 2014-05-08 NOTE — Progress Notes (Signed)
Pre visit review using our clinic review tool, if applicable. No additional management support is needed unless otherwise documented below in the visit note. 

## 2014-05-08 NOTE — Progress Notes (Signed)
   Subjective:    Patient ID: Samuel Harrell, male    DOB: 1975-11-25, 39 y.o.   MRN: [id number]  HPI Here for 2 weeks of PND, chest congestion and coughing up yellow sputum. No fever. On Mucinex. He has taken some of his wife's Augmentin for the past 3 days.   Review of Systems  Constitutional: Negative.   HENT: Positive for congestion and postnasal drip. Negative for sinus pressure.   Eyes: Negative.   Respiratory: Positive for cough and chest tightness. Negative for shortness of breath and wheezing.        Objective:   Physical Exam  Constitutional: He appears well-developed and well-nourished.  HENT:  Right Ear: External ear normal.  Left Ear: External ear normal.  Nose: Nose normal.  Mouth/Throat: Oropharynx is clear and moist.  Eyes: Conjunctivae are normal.  Pulmonary/Chest: Effort normal. No respiratory distress. He has no wheezes. He has no rales.  Scattered rhonchi   Lymphadenopathy:    He has no cervical adenopathy.          Assessment & Plan:  Bronchitis. Given a Zpack. Written out of work today.

## 2015-06-29 ENCOUNTER — Ambulatory Visit (INDEPENDENT_AMBULATORY_CARE_PROVIDER_SITE_OTHER): Payer: BLUE CROSS/BLUE SHIELD | Admitting: Family Medicine

## 2015-06-29 ENCOUNTER — Encounter: Payer: Self-pay | Admitting: Family Medicine

## 2015-06-29 VITALS — BP 117/71 | HR 70 | Temp 98.2°F | Ht 75.25 in | Wt 270.0 lb

## 2015-06-29 DIAGNOSIS — R42 Dizziness and giddiness: Secondary | ICD-10-CM | POA: Diagnosis not present

## 2015-06-29 DIAGNOSIS — S39011A Strain of muscle, fascia and tendon of abdomen, initial encounter: Secondary | ICD-10-CM | POA: Diagnosis not present

## 2015-06-29 NOTE — Progress Notes (Signed)
   Subjective:    Patient ID: Samuel Harrell, male    DOB: 1975/11/16, 40 y.o.   MRN: [id number]  HPI Here for 2 things. He and his family spent last weekend at an indoor water park and of course he rode a lot of water rides. One ride consisted of riding an a raft and then he needed to climb off the raft and go up some steps at the end of the ride. As he twisted to get up off the raft he felt a sharp pain in the left groin. Ever since he has had some mild discomfort in the groin. He took Advil the first day and this helped. No trouble with bowels or urinations. No fever. He has had some slight nausea this week but no vomiting. Then this am as he fot up out of bed he felt some dizziness which made him feel as if the room was spinning. He has felt some mild dizziness off and on since then. No headaches or earaches. No hearing changes.    Review of Systems  Constitutional: Negative.   HENT: Negative for congestion, ear pain, hearing loss, sinus pressure and tinnitus.   Eyes: Negative.   Respiratory: Negative.   Cardiovascular: Negative.   Gastrointestinal: Positive for nausea and abdominal pain. Negative for vomiting, diarrhea, constipation, blood in stool and abdominal distention.  Genitourinary: Negative.   Neurological: Positive for dizziness. Negative for tremors, seizures, syncope, facial asymmetry, speech difficulty, weakness, light-headedness, numbness and headaches.       Objective:   Physical Exam  Constitutional: He is oriented to person, place, and time. He appears well-developed and well-nourished. No distress.  HENT:  Head: Normocephalic and atraumatic.  Right Ear: External ear normal.  Left Ear: External ear normal.  Nose: Nose normal.  Eyes: Conjunctivae and EOM are normal. Pupils are equal, round, and reactive to light.  Neck: Neck supple. No thyromegaly present.  Cardiovascular: Normal rate, regular rhythm, normal heart sounds and intact distal pulses.     Pulmonary/Chest: Effort normal and breath sounds normal.  Abdominal: Soft. Bowel sounds are normal. He exhibits no distension and no mass. There is no rebound and no guarding.  Mildly tender in the left inguinal region, no hernias   Lymphadenopathy:    He has no cervical adenopathy.  Neurological: He is alert and oriented to person, place, and time. No cranial nerve deficit. He exhibits normal muscle tone. Coordination normal.          Assessment & Plan:  He has a mild abdominal muscle strain and also a mild case of vertigo. He will rest and drink fluids this weekend. Both of these should resolve soon.  Nelwyn SalisburyFRY,Harmon Bommarito A, MD

## 2015-06-29 NOTE — Progress Notes (Signed)
Pre visit review using our clinic review tool, if applicable. No additional management support is needed unless otherwise documented below in the visit note. 

## 2015-10-12 ENCOUNTER — Telehealth: Payer: Self-pay | Admitting: Internal Medicine

## 2015-10-12 NOTE — Telephone Encounter (Signed)
Pt was seen years ago by Dr. Marina GoodellPerry for IBS, had colon done in 2005. Pt is having more loose stools and urgency. Requesting to be seen by Dr. Marina GoodellPerry. Pt scheduled to see Dr. Marina GoodellPerry 11/27/15@8 :30am. Pts wife aware of appt.

## 2015-11-27 ENCOUNTER — Encounter (INDEPENDENT_AMBULATORY_CARE_PROVIDER_SITE_OTHER): Payer: Self-pay

## 2015-11-27 ENCOUNTER — Encounter: Payer: Self-pay | Admitting: Internal Medicine

## 2015-11-27 ENCOUNTER — Other Ambulatory Visit (INDEPENDENT_AMBULATORY_CARE_PROVIDER_SITE_OTHER): Payer: BLUE CROSS/BLUE SHIELD

## 2015-11-27 ENCOUNTER — Ambulatory Visit (INDEPENDENT_AMBULATORY_CARE_PROVIDER_SITE_OTHER): Payer: BLUE CROSS/BLUE SHIELD | Admitting: Internal Medicine

## 2015-11-27 VITALS — BP 108/70 | HR 76 | Ht 75.75 in | Wt 266.4 lb

## 2015-11-27 DIAGNOSIS — R197 Diarrhea, unspecified: Secondary | ICD-10-CM | POA: Diagnosis not present

## 2015-11-27 LAB — IGA: IgA: 220 mg/dL (ref 68–378)

## 2015-11-27 MED ORDER — NA SULFATE-K SULFATE-MG SULF 17.5-3.13-1.6 GM/177ML PO SOLN: 1.0000 | Freq: Once | ORAL | 0 refills | Status: AC

## 2015-11-27 NOTE — Progress Notes (Signed)
HISTORY OF PRESENT ILLNESS:  Samuel Harrell is a 40 y.o. male [employer] with a presumptive diagnosis of IBS who presents today regarding chief complaints of recurrent diarrhea with urgency. Patient reports problems with intermittent diarrhea and urgency for many years. For this complaint, as well as hematochezia, he underwent colonoscopy 09/08/2003. The colon and terminal ileum were normal. He has not been seen since. The patient reports that symptoms have worsened over the past 2-3 years. His mother, Samuel Harrell, has a history of ulcerative colitis. He is concerned. Currently he reports symptoms approximately once per week. He have several episodes per week or may go 1 entire week without symptoms. Not necessarily triggered by food or stress. He will develop abrupt onset abdominal cramping for which she needs to find a restroom for fear of incontinence. He may have more than 1 episode per day. He has self medicated after developing symptoms with Pepto-Bismol and Imodium. AcipHex uncertain. He does see mucus with some regularity. Only trivial amounts of blood on the tissue with wiping felt secondary to local irritation. No weight loss. Otherwise no abdominal pain. His only as needed medication is Tylenol. Was not quizzed on OTC or sugar substitutes.  REVIEW OF SYSTEMS:  All non-GI ROS negative except for anxiety  Past Medical History:  Diagnosis Date  . Headache(784.0)   . IBS (irritable bowel syndrome)     History reviewed. No pertinent surgical history.  Social History Samuel Harrell  reports that he has never smoked. He has never used smokeless tobacco. He reports that he drinks alcohol. He reports that he does not use drugs.  family history includes Colitis in his mother.  No Known Allergies     PHYSICAL EXAMINATION: Vital signs: BP 108/70   Pulse 76   Ht 6' 3.75" (1.924 m)   Wt 266 lb 6 oz (120.8 kg)   BMI 32.64 kg/m   Constitutional: generally well-appearing, no acute  distress Psychiatric: alert and oriented x3, cooperative Eyes: extraocular movements intact, anicteric, conjunctiva pink Mouth: oral pharynx moist, no lesions Neck: supple no lymphadenopathy Cardiovascular: heart regular rate and rhythm, no murmur Lungs: clear to auscultation bilaterally Abdomen: soft, nontender, nondistended, no obvious ascites, no peritoneal signs, normal bowel sounds, no organomegaly Rectal:Deferred until colonoscopy Extremities: no clubbing cyanosis or lower extremity edema bilaterally Skin: no lesions on visible extremities Neuro: No focal deficits. Cranial nerves intact  ASSESSMENT:  #1. Recurrent problems with abdominal cramping followed by urgency and diarrhea. Most consistent with IBS. Rule out microscopic colitis. Rule out celiac disease.  PLAN:  #1. Screen for celiac disease with tissue transglutaminase antibody IgA and serum IgA level #2. Schedule colonoscopy with biopsies.The nature of the procedure, as well as the risks, benefits, and alternatives were carefully and thoroughly reviewed with the patient. Ample time for discussion and questions allowed. The patient understood, was satisfied, and agreed to proceed. #3. Treatment recommendations to be determined pending results. May benefit from fiber or Colestid. Question on OTC agents and sugar substitutes upcoming

## 2015-11-27 NOTE — Patient Instructions (Signed)
Your physician has requested that you go to the basement for the following lab work before leaving today:  TTG, IGA  You have been scheduled for a colonoscopy. Please follow written instructions given to you at your visit today.  Please pick up your prep supplies at the pharmacy within the next 1-3 days. If you use inhalers (even only as needed), please bring them with you on the day of your procedure.  

## 2015-11-28 LAB — TISSUE TRANSGLUTAMINASE, IGA: TISSUE TRANSGLUTAMINASE AB, IGA: 1 U/mL (ref <4)

## 2016-01-04 ENCOUNTER — Telehealth: Payer: Self-pay | Admitting: Family Medicine

## 2016-01-04 NOTE — Telephone Encounter (Signed)
done

## 2016-01-04 NOTE — Telephone Encounter (Signed)
Wife is wanting to use the remainder of her flexible spending account and she is requesting a rx written out for pt to purchase  Ibuprofen pepto bismol Good sense migraine relief  All pt needs is a rx with these drugs on it.  Wife can purchase their company's website, and will be paid for with an uploaded rx.

## 2016-01-04 NOTE — Telephone Encounter (Signed)
Script is ready for pick up here at front office and I left a voice message with this information.  

## 2016-01-31 ENCOUNTER — Encounter: Payer: Self-pay | Admitting: Internal Medicine

## 2016-02-12 ENCOUNTER — Encounter: Payer: Self-pay | Admitting: Internal Medicine

## 2016-02-12 ENCOUNTER — Ambulatory Visit (AMBULATORY_SURGERY_CENTER): Payer: BLUE CROSS/BLUE SHIELD | Admitting: Internal Medicine

## 2016-02-12 VITALS — BP 123/78 | HR 62 | Temp 97.3°F | Resp 16 | Ht 75.75 in | Wt 266.0 lb

## 2016-02-12 DIAGNOSIS — R197 Diarrhea, unspecified: Secondary | ICD-10-CM

## 2016-02-12 HISTORY — PX: COLONOSCOPY: SHX174

## 2016-02-12 MED ORDER — SODIUM CHLORIDE 0.9 % IV SOLN: 500.0000 mL | INTRAVENOUS | Status: DC

## 2016-02-12 NOTE — Progress Notes (Signed)
Called to room to assist during endoscopic procedure.  Patient ID and intended procedure confirmed with present staff. Received instructions for my participation in the procedure from the performing physician.  

## 2016-02-12 NOTE — Patient Instructions (Signed)
HANDOUT GIVEN: DIVERTICULOSIS   YOU HAD AN ENDOSCOPIC PROCEDURE TODAY AT THE [facility]:   Refer to the procedure report that was given to you for any specific questions about what was found during the examination.  If the procedure report does not answer your questions, please call your gastroenterologist to clarify.  If you requested that your care partner not be given the details of your procedure findings, then the procedure report has been included in a sealed envelope for you to review at your convenience later.  YOU SHOULD EXPECT: Some feelings of bloating in the abdomen. Passage of more gas than usual.  Walking can help get rid of the air that was put into your GI tract during the procedure and reduce the bloating. If you had a lower endoscopy (such as a colonoscopy or flexible sigmoidoscopy) you may notice spotting of blood in your stool or on the toilet paper. If you underwent a bowel prep for your procedure, you may not have a normal bowel movement for a few days.  Please Note:  You might notice some irritation and congestion in your nose or some drainage.  This is from the oxygen used during your procedure.  There is no need for concern and it should clear up in a day or so.  SYMPTOMS TO REPORT IMMEDIATELY:   Following lower endoscopy (colonoscopy or flexible sigmoidoscopy):  Excessive amounts of blood in the stool  Significant tenderness or worsening of abdominal pains  Swelling of the abdomen that is new, acute  Fever of 100F or higher  For urgent or emergent issues, a gastroenterologist can be reached at any hour by calling [phone].   DIET:  We do recommend a small meal at first, but then you may proceed to your regular diet.  Drink plenty of fluids but you should avoid alcoholic beverages for 24 hours.  ACTIVITY:  You should plan to take it easy for the rest of today and you should NOT DRIVE or use heavy machinery until tomorrow (because of the sedation  medicines used during the test).    FOLLOW UP: Our staff will call the number listed on your records the next business day following your procedure to check on you and address any questions or concerns that you may have regarding the information given to you following your procedure. If we do not reach you, we will leave a message.  However, if you are feeling well and you are not experiencing any problems, there is no need to return our call.  We will assume that you have returned to your regular daily activities without incident.  If any biopsies were taken you will be contacted by phone or by letter within the next 1-3 weeks.  Please call us at [phone] if you have not heard about the biopsies in 3 weeks.    SIGNATURES/CONFIDENTIALITY: You and/or your care partner have signed paperwork which will be entered into your electronic medical record.  These signatures attest to the fact that that the information above on your After Visit Summary has been reviewed and is understood.  Full responsibility of the confidentiality of this discharge information lies with you and/or your care-partner.

## 2016-02-12 NOTE — Op Note (Signed)
[facility] Patient Name: Samuel Harrell Procedure Date: 02/12/2016 8:06 AM MRN: [id number] Endoscopist: Wilhemina Bonito. Marina Goodell , MD Age: 41 Referring MD:  Date of Birth: 11/15/1975 Gender: Male Account #: [id number] Procedure:                Colonoscopy, with biopsies Indications:              Clinically significant diarrhea of unexplained                            origin Medicines:                Monitored Anesthesia Care Procedure:                Pre-Anesthesia Assessment:                           - Prior to the procedure, a History and Physical                            was performed, and patient medications and                            allergies were reviewed. The patient's tolerance of                            previous anesthesia was also reviewed. The risks                            and benefits of the procedure and the sedation                            options and risks were discussed with the patient.                            All questions were answered, and informed consent                            was obtained. Prior Anticoagulants: The patient has                            taken no previous anticoagulant or antiplatelet                            agents. ASA Grade Assessment: II - A patient with                            mild systemic disease. After reviewing the risks                            and benefits, the patient was deemed in                            satisfactory condition to undergo the procedure.  After obtaining informed consent, the colonoscope                            was passed under direct vision. Throughout the                            procedure, the patient's blood pressure, pulse, and                            oxygen saturations were monitored continuously. The                            Model CF-HQ190L [id number](SN#[id number]) scope was introduced                            through the anus and advanced to the the  cecum,                            identified by appendiceal orifice and ileocecal                            valve. The terminal ileum, ileocecal valve,                            appendiceal orifice, and rectum were photographed.                            The quality of the bowel preparation was excellent.                            The colonoscopy was performed without difficulty.                            The patient tolerated the procedure well. The bowel                            preparation used was SUPREP. Scope In: 8:16:20 AM Scope Out: 8:34:19 AM Scope Withdrawal Time: 0 hours 14 minutes 27 seconds  Total Procedure Duration: 0 hours 17 minutes 59 seconds  Findings:                 The terminal ileum appeared normal.                           A few small-mouthed diverticula were found in the                            sigmoid colon. Biopsies for histology were taken                            with a cold forceps from the entire colon for                            evaluation of microscopic colitis.  The exam was otherwise without abnormality on                            direct and retroflexion views. Complications:            No immediate complications. Estimated blood loss:                            None. Estimated Blood Loss:     Estimated blood loss: none. Impression:               - The examined portion of the ileum was normal.                           - Diverticulosis in the sigmoid colon.                           - The examination was otherwise normal on direct                            and retroflexion views.                           - Random biopsies obtained. Recommendation:           - Repeat colonoscopy in 10 years for screening                            purposes.                           - Patient has a contact number available for                            emergencies. The signs and symptoms of potential                             delayed complications were discussed with the                            patient. Return to normal activities tomorrow.                            Written discharge instructions were provided to the                            patient.                           - Resume previous diet.                           - Continue present medications.                           - Await pathology results. We will send you a  letter with the results and any further                            recommendations. May consider a trial of Colestid                            if biopsies negative. Wilhemina Bonito. Marina Goodell, MD 02/12/2016 8:41:35 AM This report has been signed electronically.

## 2016-02-12 NOTE — Progress Notes (Signed)
Report to PACU, RN, vss, BBS= Clear.  

## 2016-02-13 ENCOUNTER — Telehealth: Payer: Self-pay

## 2016-02-13 NOTE — Telephone Encounter (Signed)
  Follow up Call-  Call back number 02/12/2016  Post procedure Call Back phone  # #[phone] cell  Permission to leave phone message Yes  Some recent data might be hidden    Patient was called for follow up after his procedure on 02/12/2016. No answer at the number given for follow up phone call. A message was left on the answering machine.

## 2016-02-13 NOTE — Telephone Encounter (Signed)
  Follow up Call-  Call back number 02/12/2016  Post procedure Call Back phone  # #[phone] cell  Permission to leave phone message Yes  Some recent data might be hidden    Patient was called for follow up after his procedure on 02/12/2016. No answer at the number given for follow up phone call. A message was left on voice mail.

## 2016-02-21 ENCOUNTER — Telehealth: Payer: Self-pay

## 2016-02-21 ENCOUNTER — Other Ambulatory Visit: Payer: Self-pay

## 2016-02-21 ENCOUNTER — Encounter: Payer: Self-pay | Admitting: Internal Medicine

## 2016-02-21 MED ORDER — COLESTIPOL HCL 1 G PO TABS: 2.0000 g | ORAL_TABLET | Freq: Two times a day (BID) | ORAL | 3 refills | Status: DC

## 2016-02-21 NOTE — Telephone Encounter (Signed)
-----   Message from Samuel FredricksonJohn N Perry, MD sent at 02/21/2016 12:44 PM EST ----- Regarding: Biopsies and recommendations Bonita QuinLinda, Please let the patient know that his biopsies were normal. I would like to start him on Colestid 2 g twice daily and have him follow-up with me in 6 weeks. I have drafted him a letter as well as this regard. Thank you

## 2016-02-21 NOTE — Telephone Encounter (Signed)
Left message for pt to call back. Script sent to pharmacy, pt scheduled to see Dr. Marina GoodellPerry 04/07/16@10 :45am.  Spoke with pt and he is aware.

## 2016-04-07 ENCOUNTER — Ambulatory Visit (INDEPENDENT_AMBULATORY_CARE_PROVIDER_SITE_OTHER): Payer: BLUE CROSS/BLUE SHIELD | Admitting: Internal Medicine

## 2016-04-07 ENCOUNTER — Encounter: Payer: Self-pay | Admitting: Internal Medicine

## 2016-04-07 VITALS — BP 122/58 | HR 98 | Ht 76.0 in | Wt 275.0 lb

## 2016-04-07 DIAGNOSIS — K9089 Other intestinal malabsorption: Secondary | ICD-10-CM

## 2016-04-07 MED ORDER — COLESTIPOL HCL 1 G PO TABS: 2.0000 g | ORAL_TABLET | Freq: Two times a day (BID) | ORAL | 3 refills | Status: DC

## 2016-04-07 NOTE — Progress Notes (Signed)
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 See that dictation. He underwent complete colonoscopy 02/12/2016. The ileum was normal. The colon was normal except for sigmoid diverticulosis. Random biopsies of the colon were normal. No microscopic colitis. The patient was placed on Colestid 2 g twice a day. He follows up at this time as requested. The patient's please report that his problems with diarrhea have completely resolved. His bowel movements are less frequent and formed. No urgency. As a matter of fact, he decreased his Colestid 1 g twice a day secondary to constipation. No new problems or issues.  REVIEW OF SYSTEMS:  All non-GI ROS negative entirely  Past Medical History:  Diagnosis Date  . Anxiety   . Bipolar 1 disorder (HCC)   . Headache(784.0)   . Herpes   . IBS (irritable bowel syndrome)     Past Surgical History:  Procedure Laterality Date  . WISDOM TOOTH EXTRACTION      Social History Samuel Corindward Matt Twining  reports that he has never smoked. He has never used smokeless tobacco. He reports that he drinks alcohol. He reports that he does not use drugs.  family history includes Colitis in his mother.  No Known Allergies     PHYSICAL EXAMINATION: Vital signs: BP (!) 122/58   Pulse 98   Ht 6\' 4"  (1.93 m)   Wt 275 lb (124.7 kg)   BMI 33.47 kg/m  General: Well-developed, well-nourished, no acute distress Abdomen:Not reexamined  Psychiatric: alert and oriented x3. Cooperative    ASSESSMENT:  #1. Bile salt related diarrhea. Improved with Colestid 1 g twice daily   PLAN:  #1. Continue Colestid 1 g twice daily. Prescribed. Titrated to need #2. Routine follow-up one year  15 minutes spent face-to-face with the patient. Near the entire time use for counseling regarding his bowel salt related diarrhea and its  management

## 2016-04-07 NOTE — Patient Instructions (Signed)
We have sent the following medications to your pharmacy for you to pick up at your convenience:  Diarrhea  Please follow up in one year

## 2016-05-26 ENCOUNTER — Ambulatory Visit (INDEPENDENT_AMBULATORY_CARE_PROVIDER_SITE_OTHER): Payer: BLUE CROSS/BLUE SHIELD | Admitting: Family Medicine

## 2016-05-26 ENCOUNTER — Encounter: Payer: Self-pay | Admitting: Family Medicine

## 2016-05-26 VITALS — BP 128/85 | HR 69 | Temp 98.3°F | Ht 76.0 in | Wt 274.0 lb

## 2016-05-26 DIAGNOSIS — M545 Low back pain, unspecified: Secondary | ICD-10-CM

## 2016-05-26 LAB — POC URINALSYSI DIPSTICK (AUTOMATED)
Bilirubin, UA: NEGATIVE
Glucose, UA: NEGATIVE
KETONES UA: NEGATIVE
Leukocytes, UA: NEGATIVE
Nitrite, UA: NEGATIVE
PH UA: 6 (ref 5.0–8.0)
PROTEIN UA: NEGATIVE
RBC UA: NEGATIVE
Spec Grav, UA: 1.02 (ref 1.010–1.025)
Urobilinogen, UA: 0.2 E.U./dL

## 2016-05-26 MED ORDER — METHYLPREDNISOLONE 4 MG PO TBPK: ORAL_TABLET | ORAL | 0 refills | Status: DC

## 2016-05-26 NOTE — Progress Notes (Signed)
   Subjective:    Patient ID: Samuel Harrell, male    DOB: 01/18/1975, 41 y.o.   MRN: [id number]  HPI Here for 2 days of left sided low back pain. This does not radiate to the legs. No numbness or weakness. No recent injury but he spent this past weekend spring cleaning around his house. Ibuprofen does not help.    Review of Systems  Constitutional: Negative.   Cardiovascular: Negative.   Gastrointestinal: Negative.   Genitourinary: Negative.   Musculoskeletal: Positive for back pain.       Objective:   Physical Exam  Constitutional:  In mild pain  Cardiovascular: Normal rate, regular rhythm, normal heart sounds and intact distal pulses.   Pulmonary/Chest: Effort normal and breath sounds normal.  Abdominal: Soft. Bowel sounds are normal. He exhibits no distension and no mass. There is no tenderness. There is no rebound and no guarding.  Musculoskeletal:  Mildly tender in the left lower back. No spasm is noted. 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 Use moist heat and take a Medrol dose pack. Recheck prn.  Gershon CraneStephen Ryla Cauthon, MD

## 2016-05-26 NOTE — Patient Instructions (Signed)
WE NOW OFFER    [facility] FAST TRACK!!!  SAME DAY Appointments for ACUTE CARE  Such as: Sprains, Injuries, cuts, abrasions, rashes, muscle pain, joint pain, back pain Colds, flu, sore throats, headache, allergies, cough, fever  Ear pain, sinus and eye infections Abdominal pain, nausea, vomiting, diarrhea, upset stomach Animal/insect bites  3 Easy Ways to Schedule: Walk-In Scheduling Call in scheduling Mychart Sign-up: [email or web address]         

## 2016-05-26 NOTE — Addendum Note (Signed)
Addended by: Aniceto BossNIMMONS, Priscillia Fouch A on: 05/26/2016 12:13 PM   Modules accepted: Orders

## 2016-09-09 ENCOUNTER — Other Ambulatory Visit: Payer: Self-pay | Admitting: Occupational Medicine

## 2016-09-09 ENCOUNTER — Ambulatory Visit: Payer: Self-pay

## 2016-09-09 DIAGNOSIS — M79672 Pain in left foot: Secondary | ICD-10-CM

## 2016-09-30 ENCOUNTER — Other Ambulatory Visit: Payer: Self-pay | Admitting: Occupational Medicine

## 2016-09-30 ENCOUNTER — Ambulatory Visit: Payer: Self-pay

## 2016-09-30 DIAGNOSIS — M79672 Pain in left foot: Secondary | ICD-10-CM

## 2016-10-02 ENCOUNTER — Encounter: Payer: Self-pay | Admitting: Family Medicine

## 2017-04-16 ENCOUNTER — Other Ambulatory Visit: Payer: Self-pay | Admitting: Internal Medicine

## 2017-07-22 ENCOUNTER — Ambulatory Visit: Payer: BLUE CROSS/BLUE SHIELD | Admitting: Family Medicine

## 2017-07-22 ENCOUNTER — Encounter: Payer: Self-pay | Admitting: Family Medicine

## 2017-07-22 VITALS — BP 138/86 | HR 93 | Temp 98.1°F | Ht 76.0 in | Wt 317.0 lb

## 2017-07-22 DIAGNOSIS — J209 Acute bronchitis, unspecified: Secondary | ICD-10-CM

## 2017-07-22 MED ORDER — CEFUROXIME AXETIL 500 MG PO TABS: 500.0000 mg | ORAL_TABLET | Freq: Two times a day (BID) | ORAL | 0 refills | Status: DC

## 2017-07-22 MED ORDER — HYDROCODONE-HOMATROPINE 5-1.5 MG/5ML PO SYRP: 5.0000 mL | ORAL_SOLUTION | ORAL | 0 refills | Status: DC | PRN

## 2017-07-22 NOTE — Progress Notes (Signed)
   Subjective:    Patient ID: Samuel Harrell, male    DOB: Nov 04, 1975, 42 y.o.   MRN: [id number]  HPI Here for recurrent coughing. He became ill about 4 weeks ago with chest congestion and coughing up yellow sputum. He went to an urgent care on 07-06-17 and was diagnosed with bronchitis. He was treated with 7 days of Augmentin. He felt better for awhile but now for the past 5 days the cough has returned. No fever. Using an albuterol inhaler and Benzonatate capsules with no relief.    Review of Systems  Constitutional: Negative.   HENT: Negative.   Eyes: Negative.   Respiratory: Positive for cough and chest tightness. Negative for shortness of breath and wheezing.   Cardiovascular: Negative.        Objective:   Physical Exam  Constitutional: He appears well-developed and well-nourished.  HENT:  Right Ear: External ear normal.  Left Ear: External ear normal.  Nose: Nose normal.  Mouth/Throat: Oropharynx is clear and moist.  Eyes: Conjunctivae are normal.  Neck: No thyromegaly present.  Pulmonary/Chest: Effort normal. No stridor. No respiratory distress. He has no wheezes. He has no rales.  Scattered rhonchi   Lymphadenopathy:    He has no cervical adenopathy.          Assessment & Plan:  Recurrent bronchitis, treat with 10 days of Ceftin. Use Hydromet as needed.  Gershon CraneStephen Fry, MD

## 2017-07-27 ENCOUNTER — Other Ambulatory Visit: Payer: Self-pay | Admitting: Internal Medicine

## 2017-08-09 ENCOUNTER — Other Ambulatory Visit: Payer: Self-pay | Admitting: Internal Medicine

## 2017-11-12 ENCOUNTER — Other Ambulatory Visit: Payer: Self-pay | Admitting: Internal Medicine

## 2017-12-05 ENCOUNTER — Other Ambulatory Visit: Payer: Self-pay | Admitting: Internal Medicine

## 2017-12-21 ENCOUNTER — Encounter: Payer: Self-pay | Admitting: Family Medicine

## 2017-12-22 ENCOUNTER — Telehealth: Payer: Self-pay

## 2017-12-22 NOTE — Telephone Encounter (Signed)
Copied from CRM [id number]. Topic: Quick Communication - See Telephone Encounter >> Dec 22, 2017 12:13 PM Stephannie LiSimmons, Janett L, NT wrote: CRM for notification. See Telephone encounter for: 12/22/17. Patients wife called to follow up on my chart message and and would like a call back at [phone]

## 2017-12-23 NOTE — Telephone Encounter (Signed)
Dr. Fry please advise. Thanks  

## 2017-12-23 NOTE — Telephone Encounter (Signed)
Dr. Clent RidgesFry please advise.  There is a part 2 of this message.

## 2017-12-24 MED ORDER — LISINOPRIL 10 MG PO TABS: 10.0000 mg | ORAL_TABLET | Freq: Every day | ORAL | 1 refills | Status: DC

## 2017-12-24 NOTE — Telephone Encounter (Signed)
meds called to the pharmacy and the pts wife is aware.  See phone note message.

## 2017-12-24 NOTE — Telephone Encounter (Signed)
I understand. No need for him to come in yet. Call in Lisinopril 10 mg daily, #30 with 2 rf. Keep the appt for the physical  next month to follow up   I have called the pts wife and she is aware of the medication that has been sent to the pharmacy for him.  He will keep his appt with [place]. [place]ent [place]Fry [place] and we will eval at that time.  They are advised to call for any issues.  meds called to the pharmacy and left on the VM due to escribe being down.

## 2017-12-24 NOTE — Telephone Encounter (Signed)
Duplicate message.  meds sent in by Dr. Clent RidgesFry.

## 2017-12-24 NOTE — Telephone Encounter (Signed)
Dr. Clent RidgesFry ---duplicate message.  pts wife is calling to follow up on my chart message.  Please advise. Thanks

## 2017-12-24 NOTE — Telephone Encounter (Signed)
Have him come in in the next few days to treat the BP. We can then recheck this at his physical in January

## 2017-12-24 NOTE — Telephone Encounter (Signed)
I understand. No need for him to come in yet. Call in Lisinopril 10 mg daily, #30 with 2 rf. Keep the appt for the physical  next month to follow up

## 2017-12-26 ENCOUNTER — Other Ambulatory Visit: Payer: Self-pay | Admitting: Internal Medicine

## 2018-01-15 ENCOUNTER — Ambulatory Visit: Payer: Self-pay | Admitting: Family Medicine

## 2018-02-04 ENCOUNTER — Encounter: Payer: Self-pay | Admitting: Family Medicine

## 2018-02-04 ENCOUNTER — Ambulatory Visit (INDEPENDENT_AMBULATORY_CARE_PROVIDER_SITE_OTHER): Payer: BLUE CROSS/BLUE SHIELD | Admitting: Family Medicine

## 2018-02-04 VITALS — BP 132/86 | HR 72 | Temp 97.7°F | Ht 77.0 in | Wt 313.2 lb

## 2018-02-04 DIAGNOSIS — Z23 Encounter for immunization: Secondary | ICD-10-CM

## 2018-02-04 DIAGNOSIS — Z0000 Encounter for general adult medical examination without abnormal findings: Secondary | ICD-10-CM

## 2018-02-04 LAB — BASIC METABOLIC PANEL
BUN: 13 mg/dL (ref 6–23)
CALCIUM: 10.4 mg/dL (ref 8.4–10.5)
CO2: 29 meq/L (ref 19–32)
Chloride: 98 mEq/L (ref 96–112)
Creatinine, Ser: 1.15 mg/dL (ref 0.40–1.50)
GFR: 68.93 mL/min (ref >60.00)
Glucose, Bld: 100 mg/dL — ABNORMAL HIGH (ref 70–99)
Potassium: 5.3 mEq/L — ABNORMAL HIGH (ref 3.5–5.1)
Sodium: 136 mEq/L (ref 135–145)

## 2018-02-04 LAB — LIPID PANEL
Cholesterol: 192 mg/dL (ref 0–200)
HDL: 37.8 mg/dL — ABNORMAL LOW (ref >39.00)
LDL Cholesterol: 117 mg/dL — ABNORMAL HIGH (ref 0–99)
NONHDL: 154.5
Total CHOL/HDL Ratio: 5
Triglycerides: 187 mg/dL — ABNORMAL HIGH (ref 0.0–149.0)
VLDL: 37.4 mg/dL (ref 0.0–40.0)

## 2018-02-04 LAB — CBC WITH DIFFERENTIAL/PLATELET
BASOS PCT: 0.5 % (ref 0.0–3.0)
Basophils Absolute: 0 10*3/uL (ref 0.0–0.1)
Eosinophils Absolute: 0.1 10*3/uL (ref 0.0–0.7)
Eosinophils Relative: 1.2 % (ref 0.0–5.0)
HCT: 45.9 % (ref 39.0–52.0)
Hemoglobin: 15.6 g/dL (ref 13.0–17.0)
Lymphocytes Relative: 27.7 % (ref 12.0–46.0)
Lymphs Abs: 2 10*3/uL (ref 0.7–4.0)
MCHC: 34.1 g/dL (ref 30.0–36.0)
MCV: 83.1 fl (ref 78.0–100.0)
MONO ABS: 0.4 10*3/uL (ref 0.1–1.0)
Monocytes Relative: 5.9 % (ref 3.0–12.0)
Neutro Abs: 4.7 10*3/uL (ref 1.4–7.7)
Neutrophils Relative %: 64.7 % (ref 43.0–77.0)
Platelets: 324 10*3/uL (ref 150.0–400.0)
RBC: 5.52 Mil/uL (ref 4.22–5.81)
RDW: 13.1 % (ref 11.5–15.5)
WBC: 7.2 10*3/uL (ref 4.0–10.5)

## 2018-02-04 LAB — HEPATIC FUNCTION PANEL
ALT: 26 U/L (ref 0–53)
AST: 18 U/L (ref 0–37)
Albumin: 4.8 g/dL (ref 3.5–5.2)
Alkaline Phosphatase: 85 U/L (ref 39–117)
Bilirubin, Direct: 0.1 mg/dL (ref 0.0–0.3)
Total Bilirubin: 0.7 mg/dL (ref 0.2–1.2)
Total Protein: 7.7 g/dL (ref 6.0–8.3)

## 2018-02-04 LAB — POC URINALSYSI DIPSTICK (AUTOMATED)
Bilirubin, UA: NEGATIVE
Blood, UA: NEGATIVE
Glucose, UA: NEGATIVE
Ketones, UA: NEGATIVE
Leukocytes, UA: NEGATIVE
Nitrite, UA: NEGATIVE
Protein, UA: NEGATIVE
SPEC GRAV UA: 1.01 (ref 1.010–1.025)
Urobilinogen, UA: 0.2 E.U./dL
pH, UA: 7 (ref 5.0–8.0)

## 2018-02-04 LAB — TSH: TSH: 3.67 u[IU]/mL (ref 0.35–4.50)

## 2018-02-04 MED ORDER — COLESTIPOL HCL 1 G PO TABS: 1.0000 g | ORAL_TABLET | Freq: Two times a day (BID) | ORAL | 3 refills | Status: DC

## 2018-02-04 MED ORDER — LISINOPRIL 20 MG PO TABS: 20.0000 mg | ORAL_TABLET | Freq: Every day | ORAL | 3 refills | Status: DC

## 2018-02-04 NOTE — Progress Notes (Signed)
Subjective:    Patient ID: Cleaster CorinEdward Matt Grenz, male    DOB: [birth year], 43 y.o.   MRN: [id number]  HPI Here for a well exam. He has a few issues to discuss. He started taking Lisinopril 10 mg daily about 2 months ago and this has been helpful. 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 They only passed him for 2 months. He tries to eat a healthy diet but he has gained 30-40 lbs over the past few years. He has recently started working out at the gym 3 days a week. 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 Toni ArthursJohn Hewitt for it. He originally had a hammertoe repair to the left 2nd toe and he did well fro awile, however he accumulated a mass of scar tissue around the site. He then had this cleaned out, but the scar tissue grew back. Now he has been referred to Dr. Lucita FerraraAaron Scott at [facility] for a second opinion. His diarrhea is well controlled by the Colestipol and he wants to stay on this.    Review of Systems  Constitutional: Negative.   HENT: Negative.   Eyes: Negative.   Respiratory: Negative.   Cardiovascular: Negative.   Gastrointestinal: Negative.   Genitourinary: Negative.   Musculoskeletal: Positive for arthralgias.  Skin: Negative.   Neurological: Negative.   Psychiatric/Behavioral: Negative.        Objective:   Physical Exam Constitutional:      General: He is not in acute distress.    Appearance: He is well-developed. He is not diaphoretic.  HENT:     Head: Normocephalic and atraumatic.     Right Ear: External ear normal.     Left Ear: External ear normal.     Nose: Nose normal.     Mouth/Throat:     Pharynx: No oropharyngeal exudate.  Eyes:     General: No scleral icterus.       Right eye: No discharge.        Left eye: No discharge.     Conjunctiva/sclera: Conjunctivae normal.     Pupils: Pupils are equal, round, and reactive to light.  Neck:     Musculoskeletal: Neck supple.     Thyroid: No thyromegaly.     Vascular: No JVD.     Trachea: No tracheal deviation.  Cardiovascular:     Rate and Rhythm: Normal rate and regular rhythm.     Heart sounds: Normal heart sounds. No murmur. No friction rub. No gallop.   Pulmonary:     Effort: Pulmonary effort is normal. No respiratory distress.     Breath sounds: Normal breath sounds. No wheezing or rales.  Chest:     Chest wall: No tenderness.  Abdominal:     General: Bowel sounds are normal. There is no distension.     Palpations: Abdomen is soft. There is no mass.     Tenderness: There is no abdominal tenderness. There is no guarding or rebound.  Genitourinary:    Penis: Normal. No tenderness.      Prostate: Normal.     Rectum: Normal. Guaiac result negative.  Musculoskeletal: Normal range of motion.     Comments: He is tender over the base of the left 2nd toe and a firm mass is felt around the  MTP joint. The ROM is severely reduced here   Lymphadenopathy:     Cervical: No cervical adenopathy.  Skin:    General: Skin is warm and dry.     Coloration: Skin is not pale.     Findings: No erythema or rash.  Neurological:     Mental Status: He is alert and oriented to person, place, and time.     Cranial Nerves: No cranial nerve deficit.     Motor: No abnormal muscle tone.     Coordination: Coordination normal.     Deep Tendon Reflexes: Reflexes are normal and symmetric. Reflexes normal.  Psychiatric:        Behavior: Behavior normal.        Thought Content: Thought content normal.        Judgment: Judgment normal.           Assessment & Plan:  Well exam. We discussed diet and exercise. Get fasting labs. We will increase the Lisinopril to 20 mg daily for the HTN. He will follow up with Orthopedics for the foot problem as above. The chronic diarrhea is well controlled.  Gershon CraneStephen Fry, MD

## 2018-02-05 ENCOUNTER — Encounter: Payer: Self-pay | Admitting: *Deleted

## 2018-03-09 ENCOUNTER — Encounter: Payer: Self-pay | Admitting: Family Medicine

## 2018-03-18 IMAGING — DX DG FOOT COMPLETE 3+V*L*
3 series · 3 of 3 positions shown · non-contrast
Comparison: None.

CLINICAL DATA: Left foot pain.  Fall 09/08/2016.  [DATE] toe pain.

EXAM:
LEFT FOOT - COMPLETE 3+ VIEW

[foot ap]
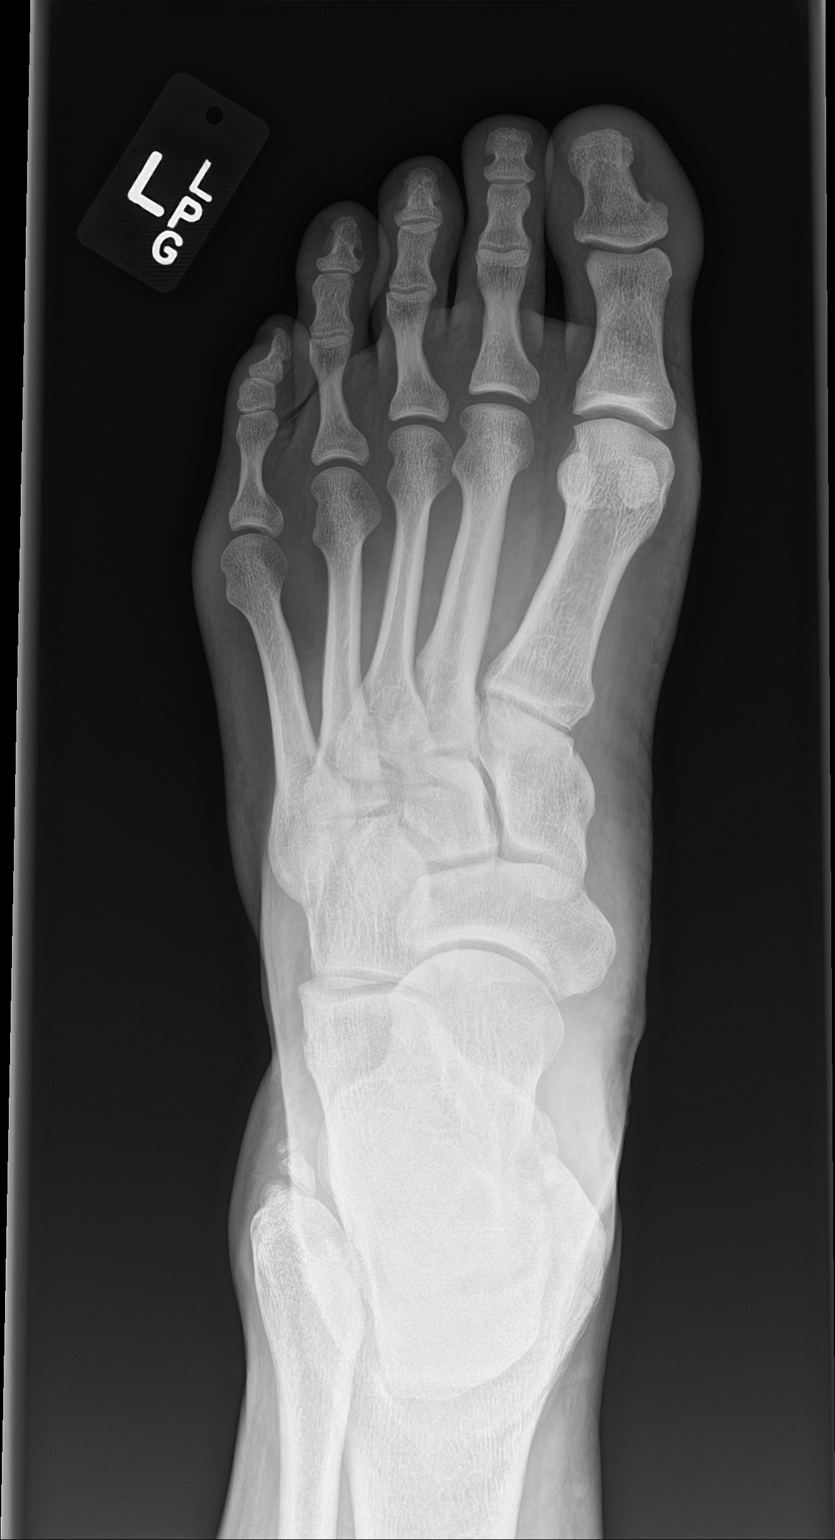

[foot obl]
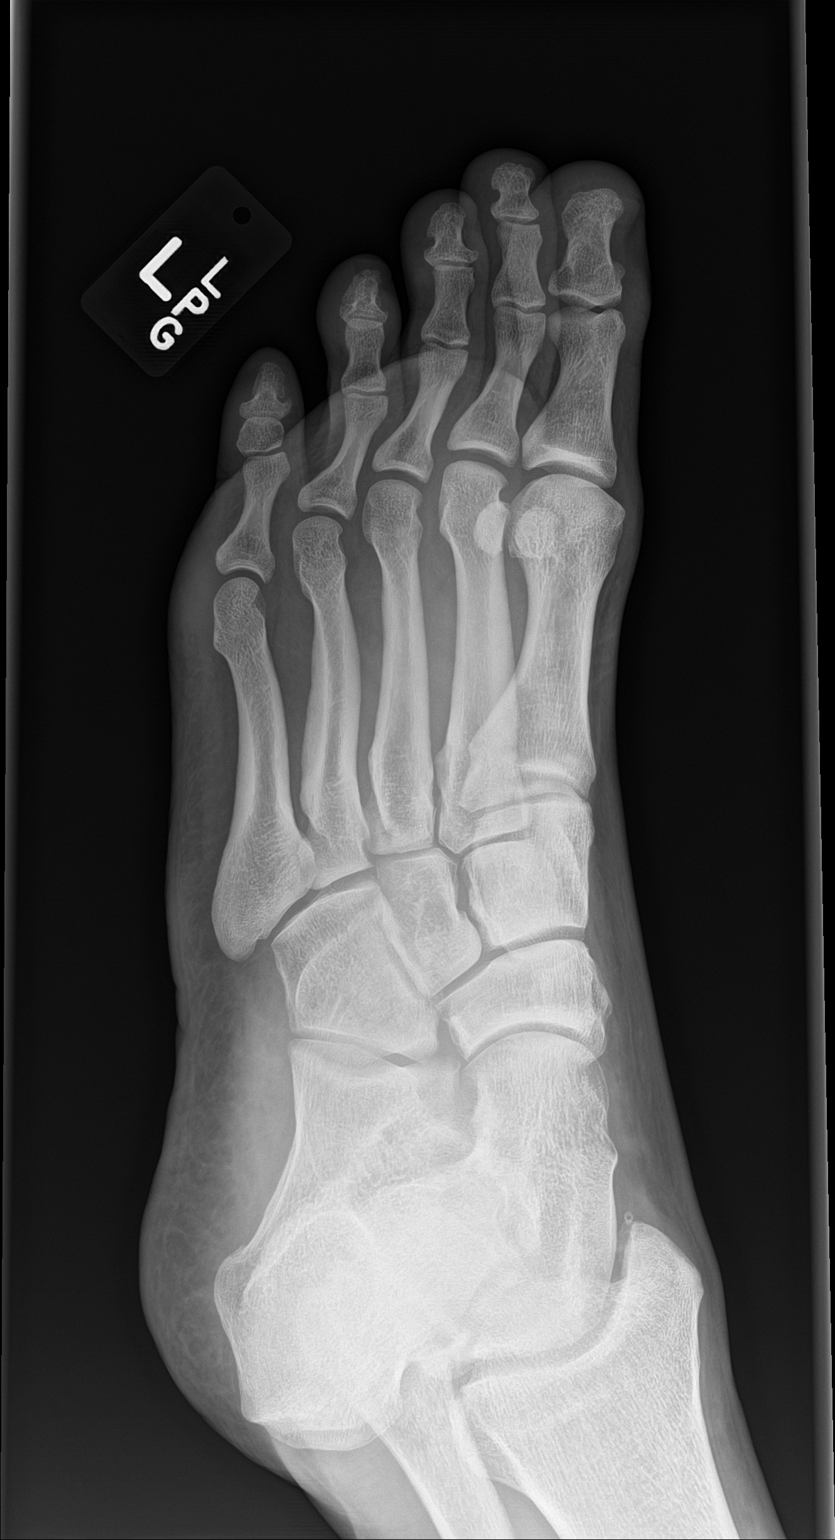

[foot lat]
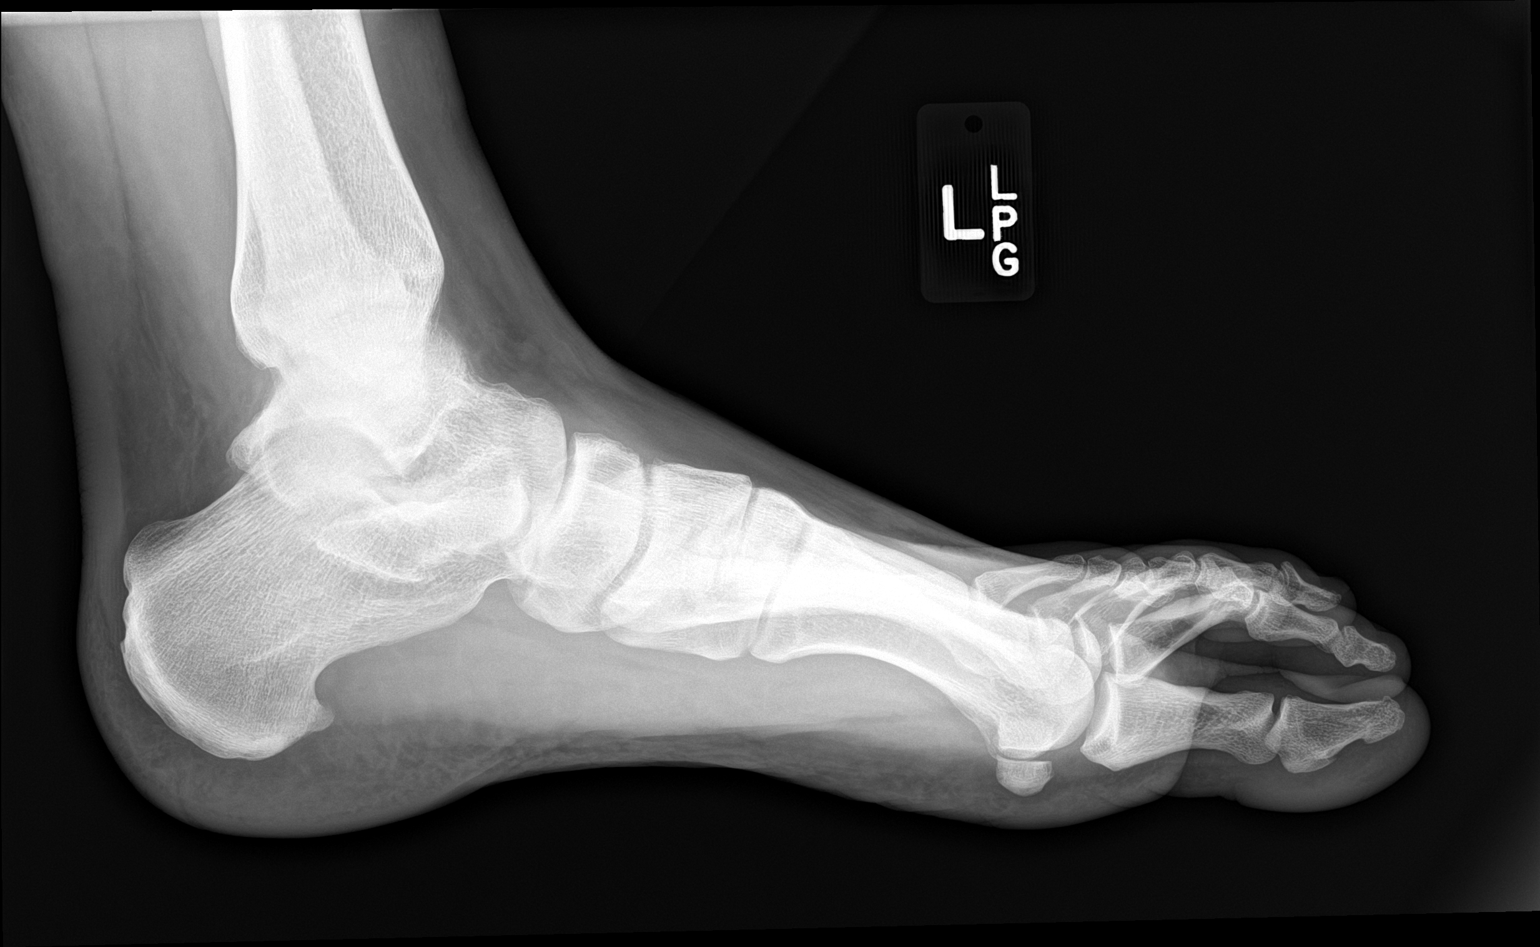

[3 of 3 positions shown; findings below may reference images not displayed]

FINDINGS: There is no evidence of fracture or dislocation. There is no
evidence of arthropathy or other focal bone abnormality. Soft
tissues are unremarkable.
IMPRESSION: Negative.

## 2018-04-28 ENCOUNTER — Encounter: Payer: Self-pay | Admitting: Family Medicine

## 2018-04-29 MED ORDER — COLESTIPOL HCL 1 G PO TABS: 1.0000 g | ORAL_TABLET | Freq: Two times a day (BID) | ORAL | 3 refills | Status: DC

## 2018-05-14 ENCOUNTER — Encounter: Payer: Self-pay | Admitting: Family Medicine

## 2018-05-14 DIAGNOSIS — R6889 Other general symptoms and signs: Secondary | ICD-10-CM

## 2018-05-17 NOTE — Telephone Encounter (Signed)
I ordered the test. He can come by our lab anytime to have this drawn

## 2018-05-20 ENCOUNTER — Other Ambulatory Visit: Payer: Self-pay

## 2018-05-20 ENCOUNTER — Other Ambulatory Visit (INDEPENDENT_AMBULATORY_CARE_PROVIDER_SITE_OTHER): Payer: BLUE CROSS/BLUE SHIELD

## 2018-05-20 DIAGNOSIS — R6889 Other general symptoms and signs: Secondary | ICD-10-CM

## 2018-05-21 LAB — SAR COV2 SEROLOGY (COVID19)AB(IGG),IA: SARS CoV2 AB IGG: NEGATIVE

## 2018-05-25 ENCOUNTER — Encounter: Payer: Self-pay | Admitting: *Deleted

## 2018-08-07 ENCOUNTER — Encounter: Payer: Self-pay | Admitting: Family Medicine

## 2018-08-09 NOTE — Telephone Encounter (Signed)
Tell patient dr Sarajane Jews is out of office   Until next week.  But I agree should get appointment to be  Seen by any provider .

## 2018-08-17 ENCOUNTER — Ambulatory Visit: Payer: BC Managed Care – PPO | Admitting: Family Medicine

## 2018-08-17 ENCOUNTER — Encounter: Payer: Self-pay | Admitting: Family Medicine

## 2018-08-17 VITALS — BP 140/64 | HR 90 | Temp 98.2°F | Wt 315.8 lb

## 2018-08-17 DIAGNOSIS — K602 Anal fissure, unspecified: Secondary | ICD-10-CM

## 2018-08-17 DIAGNOSIS — N50812 Left testicular pain: Secondary | ICD-10-CM | POA: Diagnosis not present

## 2018-08-17 MED ORDER — NAPROXEN 500 MG PO TABS: 500.0000 mg | ORAL_TABLET | Freq: Two times a day (BID) | ORAL | 5 refills | Status: DC | PRN

## 2018-08-17 MED ORDER — DILTIAZEM GEL 2 %: 1.0000 "application " | Freq: Two times a day (BID) | CUTANEOUS | 5 refills | Status: DC

## 2018-08-17 NOTE — Progress Notes (Signed)
   Subjective:    Patient ID: Samuel Harrell, male    DOB: 12/18/1975, 43 y.o.   MRN: [id number]  HPI Here for 2 issues. First he has had intermittent mild pains in the left testicle for the past 6 months. No swelling, no hx of trauma. No urinary symptoms or DC. He gets excellent relief by taking Naproxen. This happens about once every 2-3 weeks. Also for the past 3 months he has had some pain at the anus when he passes a stool. His stools are soft and regular. He takes Colestipol daily and this controls his diarrhea. He does not strain when he has a BM. Sometimes he sees a little blood on the paper when he wipes.    Review of Systems  Constitutional: Negative.   Respiratory: Negative.   Cardiovascular: Negative.   Gastrointestinal: Positive for anal bleeding and rectal pain. Negative for abdominal distention, abdominal pain, blood in stool, constipation, diarrhea, nausea and vomiting.  Genitourinary: Positive for testicular pain. Negative for discharge, dysuria, frequency, hematuria, scrotal swelling and urgency.       Objective:   Physical Exam Constitutional:      Appearance: Normal appearance.  Cardiovascular:     Rate and Rhythm: Normal rate and regular rhythm.     Pulses: Normal pulses.     Heart sounds: Normal heart sounds.  Pulmonary:     Effort: Pulmonary effort is normal.     Breath sounds: Normal breath sounds.  Abdominal:     General: Abdomen is flat. Bowel sounds are normal. There is no distension.     Palpations: Abdomen is soft. There is no mass.     Tenderness: There is no abdominal tenderness. There is no guarding or rebound.     Hernia: No hernia is present.  Genitourinary:    Scrotum/Testes: Normal.     Prostate: Normal.     Comments: Both testicles are normal with no tenderness at all. There is a small anal fissure  Neurological:     Mental Status: He is alert.           Assessment & Plan:  For the testicular pain he will continue to use Naproxen  as needed. He will follow up prn. For the anal fissure he will apply Diltiazem gel BID until this heals.  Alysia Penna, MD

## 2018-09-10 ENCOUNTER — Encounter: Payer: Self-pay | Admitting: Family Medicine

## 2018-09-13 MED ORDER — LOSARTAN POTASSIUM 50 MG PO TABS: 50.0000 mg | ORAL_TABLET | Freq: Every day | ORAL | 3 refills | Status: DC

## 2018-09-13 NOTE — Telephone Encounter (Signed)
Stop Lisinopril and call in Losartan 50 mg daily, #90 with 3 rf

## 2018-11-12 ENCOUNTER — Other Ambulatory Visit: Payer: Self-pay | Admitting: Family Medicine

## 2018-11-12 MED ORDER — COLESTIPOL HCL 1 G PO TABS: 1.0000 g | ORAL_TABLET | Freq: Two times a day (BID) | ORAL | 0 refills | Status: DC

## 2018-11-12 NOTE — Telephone Encounter (Signed)
Pt states he forgot this rx at home and he is on vacation.  He is requesting a partial refill be called in to:   [id number] - [place], [address]  [place] [address]  Phone: [phone] Fax: [phone]

## 2019-03-01 ENCOUNTER — Telehealth: Payer: Self-pay | Admitting: Family Medicine

## 2019-03-01 MED ORDER — ONDANSETRON HCL 8 MG PO TABS: 8.0000 mg | ORAL_TABLET | Freq: Three times a day (TID) | ORAL | 0 refills | Status: DC | PRN

## 2019-03-01 NOTE — Telephone Encounter (Signed)
Pt's been having diarrhea for 2 days and some nausea. He believes it is from his pre-existing condition (IBS). He would like to know if Clent Ridges can recommened anything over the counter or prescribed him something for it.  Pt can be reached at [phone]

## 2019-03-01 NOTE — Telephone Encounter (Signed)
For the diarrhea, try Imodium (OTC). For the nausea, call in Zofran 8 mg to take every 8 hours as needed, #30 with no rf

## 2019-03-17 ENCOUNTER — Encounter: Payer: Self-pay | Admitting: Family Medicine

## 2019-03-17 NOTE — Telephone Encounter (Signed)
Yes he may take Ibuprofen now as needed

## 2019-05-24 ENCOUNTER — Other Ambulatory Visit: Payer: Self-pay | Admitting: Family Medicine

## 2019-05-26 NOTE — Telephone Encounter (Signed)
Please advise. This was last filled by Dr. Durene Cal.

## 2019-09-05 ENCOUNTER — Other Ambulatory Visit: Payer: Self-pay | Admitting: Family Medicine

## 2019-09-23 ENCOUNTER — Other Ambulatory Visit: Payer: Self-pay

## 2019-09-23 ENCOUNTER — Ambulatory Visit: Payer: BC Managed Care – PPO | Admitting: Family Medicine

## 2019-09-23 ENCOUNTER — Encounter: Payer: Self-pay | Admitting: Family Medicine

## 2019-09-23 VITALS — BP 112/84 | HR 74 | Temp 97.9°F | Wt 300.8 lb

## 2019-09-23 DIAGNOSIS — R42 Dizziness and giddiness: Secondary | ICD-10-CM

## 2019-09-23 NOTE — Progress Notes (Signed)
   Subjective:    Patient ID: Samuel Harrell, male    DOB: 26-Jul-1975, 44 y.o.   MRN: [id number]  HPI Here for 3 days of intermittent dizziness. He describes it as the room spinning. This happens when he moves his head quickly up or down, and it lasts a few seconds. He has had some mild pressure in the right ear and some stuffy nose. No fever or headache. No ST or cough. He has some nausea but has not vomited.    Review of Systems  Constitutional: Negative.   HENT: Positive for congestion. Negative for facial swelling, postnasal drip and sinus pain.   Eyes: Negative.   Respiratory: Negative.   Cardiovascular: Negative.   Neurological: Positive for dizziness. Negative for tremors, seizures, syncope, facial asymmetry, speech difficulty, weakness, light-headedness, numbness and headaches.       Objective:   Physical Exam Constitutional:      General: He is not in acute distress.    Appearance: Normal appearance.  HENT:     Head: Normocephalic and atraumatic.     Right Ear: Tympanic membrane, ear canal and external ear normal.     Left Ear: Tympanic membrane, ear canal and external ear normal.     Nose: Nose normal.     Mouth/Throat:     Pharynx: Oropharynx is clear.  Eyes:     Conjunctiva/sclera: Conjunctivae normal.  Pulmonary:     Effort: Pulmonary effort is normal.     Breath sounds: Normal breath sounds.  Lymphadenopathy:     Cervical: No cervical adenopathy.  Neurological:     Mental Status: He is alert.           Assessment & Plan:  Vertigo, he will drink fluids and take Zyrtec BID for a week or two. Recheck prn. Gershon Crane, MD

## 2019-09-26 ENCOUNTER — Other Ambulatory Visit: Payer: Self-pay

## 2019-09-26 ENCOUNTER — Encounter: Payer: Self-pay | Admitting: Family Medicine

## 2019-09-26 DIAGNOSIS — R3589 Other polyuria: Secondary | ICD-10-CM

## 2019-09-27 ENCOUNTER — Ambulatory Visit: Payer: BC Managed Care – PPO | Admitting: Family Medicine

## 2019-09-27 ENCOUNTER — Encounter: Payer: Self-pay | Admitting: Family Medicine

## 2019-09-27 VITALS — BP 130/78 | HR 79 | Temp 98.4°F | Ht 76.0 in | Wt 301.8 lb

## 2019-09-27 DIAGNOSIS — R3589 Other polyuria: Secondary | ICD-10-CM

## 2019-09-27 DIAGNOSIS — R358 Other polyuria: Secondary | ICD-10-CM | POA: Diagnosis not present

## 2019-09-27 DIAGNOSIS — R739 Hyperglycemia, unspecified: Secondary | ICD-10-CM | POA: Diagnosis not present

## 2019-09-27 NOTE — Telephone Encounter (Signed)
I ordered some lab tests. He can make a lab appt

## 2019-09-27 NOTE — Progress Notes (Signed)
   Subjective:    Patient ID: Samuel Harrell, male    DOB: 06/08/1975, 44 y.o.   MRN: [id number]  HPI Here for continuing feelings of lightheadedness, weakness, and frequent urinations. No fever. No cough or SOB. No abdominal pain or NVD. He does urinate quite frequently, no burning or urgency. We saw him on 09-23-19 when he described what sounded like vertigo, and he has been taking Zyrtec since then, but he feels no better.    Review of Systems  Constitutional: Positive for fatigue. Negative for chills, diaphoresis and fever.  HENT: Negative.   Eyes: Negative.   Respiratory: Negative.   Cardiovascular: Negative.   Gastrointestinal: Negative.   Genitourinary: Positive for frequency. Negative for dysuria, flank pain and urgency.  Neurological: Positive for light-headedness. Negative for headaches.       Objective:   Physical Exam        Assessment & Plan:  Feelings of lightheadedness and frequent urination. Possible etiologies include diabetes. We will get labs today to include an A1c. He will stop the antihistamine.  Gershon Crane, MD

## 2019-09-28 ENCOUNTER — Encounter: Payer: Self-pay | Admitting: Family Medicine

## 2019-09-28 LAB — HEPATIC FUNCTION PANEL
AG Ratio: 1.8 (calc) (ref 1.0–2.5)
ALT: 24 U/L (ref 9–46)
AST: 13 U/L (ref 10–40)
Albumin: 4.9 g/dL (ref 3.6–5.1)
Alkaline phosphatase (APISO): 82 U/L (ref 36–130)
Bilirubin, Direct: 0.1 mg/dL (ref 0.0–0.2)
Globulin: 2.8 g/dL (calc) (ref 1.9–3.7)
Indirect Bilirubin: 0.5 mg/dL (calc) (ref 0.2–1.2)
Total Bilirubin: 0.6 mg/dL (ref 0.2–1.2)
Total Protein: 7.7 g/dL (ref 6.1–8.1)

## 2019-09-28 LAB — BASIC METABOLIC PANEL
BUN: 14 mg/dL (ref 7–25)
CO2: 27 mmol/L (ref 20–32)
Calcium: 9.8 mg/dL (ref 8.6–10.3)
Chloride: 101 mmol/L (ref 98–110)
Creat: 1.13 mg/dL (ref 0.60–1.35)
Glucose, Bld: 87 mg/dL (ref 65–99)
Potassium: 4.6 mmol/L (ref 3.5–5.3)
Sodium: 139 mmol/L (ref 135–146)

## 2019-09-28 LAB — URINALYSIS
Bilirubin Urine: NEGATIVE
Glucose, UA: NEGATIVE
Hgb urine dipstick: NEGATIVE
Ketones, ur: NEGATIVE
Leukocytes,Ua: NEGATIVE
Nitrite: NEGATIVE
Protein, ur: NEGATIVE
Specific Gravity, Urine: 1.013 (ref 1.001–1.03)
pH: 6.5 (ref 5.0–8.0)

## 2019-09-28 LAB — HEMOGLOBIN A1C
Hgb A1c MFr Bld: 5.7 % of total Hgb — ABNORMAL HIGH (ref <5.7)
Mean Plasma Glucose: 117 (calc)
eAG (mmol/L): 6.5 (calc)

## 2019-09-28 LAB — CBC WITH DIFFERENTIAL/PLATELET
Absolute Monocytes: 569 cells/uL (ref 200–950)
Basophils Absolute: 32 cells/uL (ref 0–200)
Basophils Relative: 0.4 %
Eosinophils Absolute: 79 cells/uL (ref 15–500)
Eosinophils Relative: 1 %
HCT: 45.8 % (ref 38.5–50.0)
Hemoglobin: 15.4 g/dL (ref 13.2–17.1)
Lymphs Abs: 2449 cells/uL (ref 850–3900)
MCH: 28.6 pg (ref 27.0–33.0)
MCHC: 33.6 g/dL (ref 32.0–36.0)
MCV: 85.1 fL (ref 80.0–100.0)
MPV: 9.2 fL (ref 7.5–12.5)
Monocytes Relative: 7.2 %
Neutro Abs: 4772 cells/uL (ref 1500–7800)
Neutrophils Relative %: 60.4 %
Platelets: 296 10*3/uL (ref 140–400)
RBC: 5.38 10*6/uL (ref 4.20–5.80)
RDW: 13.4 % (ref 11.0–15.0)
Total Lymphocyte: 31 %
WBC: 7.9 10*3/uL (ref 3.8–10.8)

## 2019-09-28 LAB — TSH: TSH: 2.16 mIU/L (ref 0.40–4.50)

## 2019-09-29 NOTE — Telephone Encounter (Signed)
I agree his symptoms could potentially be explained by a Covid infection. I advise him to get tested for the virus as soon as he can. The labs do not provide any explanation for the symptoms and I am at a loss about how to explain them. Let us know if anything changes

## 2019-10-05 ENCOUNTER — Other Ambulatory Visit: Payer: Self-pay | Admitting: Family Medicine

## 2019-10-07 ENCOUNTER — Encounter: Payer: Self-pay | Admitting: Family Medicine

## 2019-10-07 DIAGNOSIS — R42 Dizziness and giddiness: Secondary | ICD-10-CM

## 2019-10-14 NOTE — Telephone Encounter (Signed)
I did a referral to Neurology for him

## 2020-05-20 ENCOUNTER — Other Ambulatory Visit: Payer: Self-pay | Admitting: Family Medicine

## 2020-09-29 ENCOUNTER — Other Ambulatory Visit: Payer: Self-pay | Admitting: Family Medicine

## 2020-10-03 NOTE — Telephone Encounter (Signed)
Sent a message advising to call the office and schedule appointment for future refills

## 2020-10-04 NOTE — Telephone Encounter (Signed)
Pt needs appointment for further refills 

## 2020-10-11 ENCOUNTER — Other Ambulatory Visit: Payer: Self-pay | Admitting: Family Medicine

## 2020-10-12 MED ORDER — LOSARTAN POTASSIUM 25 MG PO TABS
50.0000 mg | ORAL_TABLET | Freq: Every day | ORAL | 0 refills | Status: DC
Start: 2020-10-12 — End: 2021-01-25

## 2020-11-05 ENCOUNTER — Other Ambulatory Visit: Payer: Self-pay | Admitting: Family Medicine

## 2020-11-14 ENCOUNTER — Other Ambulatory Visit: Payer: Self-pay | Admitting: Family Medicine

## 2021-01-03 ENCOUNTER — Encounter: Payer: Self-pay | Admitting: Internal Medicine

## 2021-01-25 ENCOUNTER — Other Ambulatory Visit: Payer: Self-pay | Admitting: Family Medicine

## 2021-02-01 ENCOUNTER — Encounter: Payer: Self-pay | Admitting: Family Medicine

## 2021-02-01 ENCOUNTER — Ambulatory Visit (INDEPENDENT_AMBULATORY_CARE_PROVIDER_SITE_OTHER): Payer: BC Managed Care – PPO | Admitting: Family Medicine

## 2021-02-01 VITALS — BP 128/92 | HR 82 | Temp 98.9°F | Ht 76.0 in | Wt 308.0 lb

## 2021-02-01 DIAGNOSIS — Z0000 Encounter for general adult medical examination without abnormal findings: Secondary | ICD-10-CM | POA: Diagnosis not present

## 2021-02-01 DIAGNOSIS — Z23 Encounter for immunization: Secondary | ICD-10-CM | POA: Diagnosis not present

## 2021-02-01 LAB — HEPATIC FUNCTION PANEL
ALT: 20 U/L (ref 0–53)
AST: 18 U/L (ref 0–37)
Albumin: 4.5 g/dL (ref 3.5–5.2)
Alkaline Phosphatase: 69 U/L (ref 39–117)
Bilirubin, Direct: 0 mg/dL (ref 0.0–0.3)
Total Bilirubin: 0.5 mg/dL (ref 0.2–1.2)
Total Protein: 7.5 g/dL (ref 6.0–8.3)

## 2021-02-01 LAB — LIPID PANEL
Cholesterol: 172 mg/dL (ref 0–200)
HDL: 43.2 mg/dL (ref >39.00)
LDL Cholesterol: 97 mg/dL (ref 0–99)
NonHDL: 129.19
Total CHOL/HDL Ratio: 4
Triglycerides: 161 mg/dL — ABNORMAL HIGH (ref 0.0–149.0)
VLDL: 32.2 mg/dL (ref 0.0–40.0)

## 2021-02-01 LAB — CBC WITH DIFFERENTIAL/PLATELET
Basophils Absolute: 0 10*3/uL (ref 0.0–0.1)
Basophils Relative: 0.5 % (ref 0.0–3.0)
Eosinophils Absolute: 0.1 10*3/uL (ref 0.0–0.7)
Eosinophils Relative: 1.2 % (ref 0.0–5.0)
HCT: 44.1 % (ref 39.0–52.0)
Hemoglobin: 15 g/dL (ref 13.0–17.0)
Lymphocytes Relative: 23.2 % (ref 12.0–46.0)
Lymphs Abs: 1.6 10*3/uL (ref 0.7–4.0)
MCHC: 34 g/dL (ref 30.0–36.0)
MCV: 83.4 fl (ref 78.0–100.0)
Monocytes Absolute: 0.5 10*3/uL (ref 0.1–1.0)
Monocytes Relative: 6.5 % (ref 3.0–12.0)
Neutro Abs: 4.8 10*3/uL (ref 1.4–7.7)
Neutrophils Relative %: 68.6 % (ref 43.0–77.0)
Platelets: 315 10*3/uL (ref 150.0–400.0)
RBC: 5.28 Mil/uL (ref 4.22–5.81)
RDW: 13.8 % (ref 11.5–15.5)
WBC: 7 10*3/uL (ref 4.0–10.5)

## 2021-02-01 LAB — BASIC METABOLIC PANEL
BUN: 13 mg/dL (ref 6–23)
CO2: 29 mEq/L (ref 19–32)
Calcium: 9.6 mg/dL (ref 8.4–10.5)
Chloride: 101 mEq/L (ref 96–112)
Creatinine, Ser: 1.06 mg/dL (ref 0.40–1.50)
GFR: 83.59 mL/min (ref >60.00)
Glucose, Bld: 88 mg/dL (ref 70–99)
Potassium: 4.4 mEq/L (ref 3.5–5.1)
Sodium: 136 mEq/L (ref 135–145)

## 2021-02-01 LAB — HEMOGLOBIN A1C: Hgb A1c MFr Bld: 6 % (ref 4.6–6.5)

## 2021-02-01 LAB — TSH: TSH: 2.98 u[IU]/mL (ref 0.35–5.50)

## 2021-02-01 MED ORDER — LOSARTAN POTASSIUM 50 MG PO TABS: 50.0000 mg | ORAL_TABLET | Freq: Every day | ORAL | 3 refills | Status: DC

## 2021-02-01 MED ORDER — COLESTIPOL HCL 1 G PO TABS: 1.0000 g | ORAL_TABLET | Freq: Two times a day (BID) | ORAL | 3 refills | Status: DC

## 2021-02-01 MED ORDER — NAPROXEN 500 MG PO TABS: 500.0000 mg | ORAL_TABLET | Freq: Two times a day (BID) | ORAL | 3 refills | Status: AC | PRN

## 2021-02-01 NOTE — Addendum Note (Signed)
Addended by: Alysia Penna A on: 02/01/2021 03:29 PM   Modules accepted: Orders

## 2021-02-01 NOTE — Addendum Note (Signed)
Addended by: Carola Rhine on: 02/01/2021 10:30 AM   Modules accepted: Orders

## 2021-02-01 NOTE — Progress Notes (Signed)
Subjective:    Patient ID: Samuel Harrell, male    DOB: 1976/01/04, 46 y.o.   MRN: [id number]  HPI Here for a well exam. He feels well except he has noticed occasional spells of dizziness that last just a few seconds. These started a week ago. He notes that he is getting over a cold and he still has some runny nose and a stuffy head. No fever or cough. He tested negative for the Covid-19 virus.    Review of Systems  Constitutional: Negative.   HENT: Negative.    Eyes: Negative.   Respiratory: Negative.    Cardiovascular: Negative.   Gastrointestinal: Negative.   Genitourinary: Negative.   Musculoskeletal: Negative.   Skin: Negative.   Neurological:  Positive for dizziness. Negative for headaches.  Psychiatric/Behavioral: Negative.        Objective:   Physical Exam Constitutional:      General: He is not in acute distress.    Appearance: Normal appearance. He is well-developed. He is not diaphoretic.  HENT:     Head: Normocephalic and atraumatic.     Right Ear: External ear normal.     Left Ear: External ear normal.     Nose: Nose normal.     Mouth/Throat:     Pharynx: No oropharyngeal exudate.  Eyes:     General: No scleral icterus.       Right eye: No discharge.        Left eye: No discharge.     Conjunctiva/sclera: Conjunctivae normal.     Pupils: Pupils are equal, round, and reactive to light.  Neck:     Thyroid: No thyromegaly.     Vascular: No JVD.     Trachea: No tracheal deviation.  Cardiovascular:     Rate and Rhythm: Normal rate and regular rhythm.     Heart sounds: Normal heart sounds. No murmur heard.   No friction rub. No gallop.  Pulmonary:     Effort: Pulmonary effort is normal. No respiratory distress.     Breath sounds: Normal breath sounds. No wheezing or rales.  Chest:     Chest wall: No tenderness.  Abdominal:     General: Bowel sounds are normal. There is no distension.     Palpations: Abdomen is soft. There is no mass.     Tenderness:  There is no abdominal tenderness. There is no guarding or rebound.  Genitourinary:    Penis: Normal. No tenderness.      Testes: Normal.     Prostate: Normal.     Rectum: Normal. Guaiac result negative.  Musculoskeletal:        General: No tenderness. Normal range of motion.     Cervical back: Neck supple.  Lymphadenopathy:     Cervical: No cervical adenopathy.  Skin:    General: Skin is warm and dry.     Coloration: Skin is not pale.     Findings: No erythema or rash.  Neurological:     Mental Status: He is alert and oriented to person, place, and time.     Cranial Nerves: No cranial nerve deficit.     Motor: No abnormal muscle tone.     Coordination: Coordination normal.     Deep Tendon Reflexes: Reflexes are normal and symmetric. Reflexes normal.  Psychiatric:        Behavior: Behavior normal.        Thought Content: Thought content normal.        Judgment: Judgment normal.  Assessment & Plan:  Well exam. He is getting over a viral URI and I think this has caused some mild vestibular symptoms. These should resolve soon. We discussed diet and exercise. Get fasting labs. Alysia Penna, MD

## 2021-02-22 ENCOUNTER — Other Ambulatory Visit: Payer: Self-pay | Admitting: Family Medicine

## 2021-11-12 ENCOUNTER — Other Ambulatory Visit: Payer: Self-pay | Admitting: Family Medicine

## 2021-11-16 ENCOUNTER — Other Ambulatory Visit: Payer: Self-pay | Admitting: Family Medicine

## 2021-11-20 ENCOUNTER — Other Ambulatory Visit: Payer: Self-pay | Admitting: Family Medicine

## 2021-11-20 ENCOUNTER — Telehealth: Payer: Self-pay | Admitting: Family Medicine

## 2021-11-20 NOTE — Telephone Encounter (Signed)
Patient's spouse wants to discuss the next steps for refills for losartan (COZAAR) 50 MG tablet requesting a 90d but has had issues with refill in the past

## 2021-11-21 ENCOUNTER — Other Ambulatory Visit: Payer: Self-pay

## 2021-11-21 ENCOUNTER — Encounter: Payer: Self-pay | Admitting: Family Medicine

## 2021-11-21 ENCOUNTER — Other Ambulatory Visit: Payer: Self-pay | Admitting: Family Medicine

## 2021-11-21 MED ORDER — LOSARTAN POTASSIUM 50 MG PO TABS: 50.0000 mg | ORAL_TABLET | Freq: Every day | ORAL | 0 refills | Status: DC

## 2021-11-21 NOTE — Telephone Encounter (Signed)
Pt Rx sent as requested 

## 2021-11-22 ENCOUNTER — Other Ambulatory Visit: Payer: Self-pay

## 2021-11-22 MED ORDER — LOSARTAN POTASSIUM 50 MG PO TABS: 50.0000 mg | ORAL_TABLET | Freq: Every day | ORAL | 0 refills | Status: DC

## 2022-02-16 ENCOUNTER — Encounter: Payer: Self-pay | Admitting: Family Medicine

## 2022-02-16 ENCOUNTER — Other Ambulatory Visit: Payer: Self-pay | Admitting: Family Medicine

## 2022-02-17 ENCOUNTER — Other Ambulatory Visit: Payer: Self-pay

## 2022-02-17 MED ORDER — LOSARTAN POTASSIUM 50 MG PO TABS: 50.0000 mg | ORAL_TABLET | Freq: Every day | ORAL | 0 refills | Status: DC

## 2022-02-17 MED ORDER — COLESTIPOL HCL 1 G PO TABS: 1.0000 g | ORAL_TABLET | Freq: Two times a day (BID) | ORAL | 0 refills | Status: DC

## 2022-03-17 ENCOUNTER — Encounter: Payer: Self-pay | Admitting: Family Medicine

## 2022-03-17 ENCOUNTER — Other Ambulatory Visit (INDEPENDENT_AMBULATORY_CARE_PROVIDER_SITE_OTHER): Payer: BC Managed Care – PPO

## 2022-03-17 ENCOUNTER — Ambulatory Visit (INDEPENDENT_AMBULATORY_CARE_PROVIDER_SITE_OTHER): Payer: BC Managed Care – PPO | Admitting: Family Medicine

## 2022-03-17 VITALS — BP 130/82 | HR 80 | Temp 98.1°F | Ht 76.0 in | Wt 311.0 lb

## 2022-03-17 DIAGNOSIS — Z0000 Encounter for general adult medical examination without abnormal findings: Secondary | ICD-10-CM

## 2022-03-17 LAB — BASIC METABOLIC PANEL
BUN: 13 mg/dL (ref 6–23)
CO2: 29 mEq/L (ref 19–32)
Calcium: 9.7 mg/dL (ref 8.4–10.5)
Chloride: 101 mEq/L (ref 96–112)
Creatinine, Ser: 1.13 mg/dL (ref 0.40–1.50)
GFR: 76.81 mL/min (ref >60.00)
Glucose, Bld: 90 mg/dL (ref 70–99)
Potassium: 4.3 mEq/L (ref 3.5–5.1)
Sodium: 138 mEq/L (ref 135–145)

## 2022-03-17 LAB — CBC WITH DIFFERENTIAL/PLATELET
Basophils Absolute: 0 10*3/uL (ref 0.0–0.1)
Basophils Relative: 0.6 % (ref 0.0–3.0)
Eosinophils Absolute: 0.1 10*3/uL (ref 0.0–0.7)
Eosinophils Relative: 1.7 % (ref 0.0–5.0)
HCT: 43.9 % (ref 39.0–52.0)
Hemoglobin: 14.9 g/dL (ref 13.0–17.0)
Lymphocytes Relative: 32.7 % (ref 12.0–46.0)
Lymphs Abs: 2.3 10*3/uL (ref 0.7–4.0)
MCHC: 34 g/dL (ref 30.0–36.0)
MCV: 84.3 fl (ref 78.0–100.0)
Monocytes Absolute: 0.4 10*3/uL (ref 0.1–1.0)
Monocytes Relative: 6.1 % (ref 3.0–12.0)
Neutro Abs: 4.2 10*3/uL (ref 1.4–7.7)
Neutrophils Relative %: 58.9 % (ref 43.0–77.0)
Platelets: 316 10*3/uL (ref 150.0–400.0)
RBC: 5.21 Mil/uL (ref 4.22–5.81)
RDW: 13.4 % (ref 11.5–15.5)
WBC: 7.2 10*3/uL (ref 4.0–10.5)

## 2022-03-17 LAB — HEPATIC FUNCTION PANEL
ALT: 19 U/L (ref 0–53)
AST: 15 U/L (ref 0–37)
Albumin: 4.3 g/dL (ref 3.5–5.2)
Alkaline Phosphatase: 69 U/L (ref 39–117)
Bilirubin, Direct: 0.1 mg/dL (ref 0.0–0.3)
Total Bilirubin: 0.6 mg/dL (ref 0.2–1.2)
Total Protein: 7.4 g/dL (ref 6.0–8.3)

## 2022-03-17 LAB — LIPID PANEL
Cholesterol: 139 mg/dL (ref 0–200)
HDL: 40.6 mg/dL (ref >39.00)
LDL Cholesterol: 68 mg/dL (ref 0–99)
NonHDL: 98.14
Total CHOL/HDL Ratio: 3
Triglycerides: 152 mg/dL — ABNORMAL HIGH (ref 0.0–149.0)
VLDL: 30.4 mg/dL (ref 0.0–40.0)

## 2022-03-17 LAB — TSH: TSH: 2.69 u[IU]/mL (ref 0.35–5.50)

## 2022-03-17 LAB — HEMOGLOBIN A1C: Hgb A1c MFr Bld: 6 % (ref 4.6–6.5)

## 2022-03-17 NOTE — Progress Notes (Signed)
   Subjective:    Patient ID: Samuel Harrell, male    DOB: 07/11/1975, 47 y.o.   MRN: [id number]  HPI Here for a well exam. He feels well. 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    Review of Systems  Constitutional: Negative.   HENT: Negative.    Eyes: Negative.   Respiratory: Negative.    Cardiovascular: Negative.   Gastrointestinal: Negative.   Genitourinary: Negative.   Musculoskeletal: Negative.   Skin: Negative.   Neurological: Negative.   Psychiatric/Behavioral: Negative.         Objective:   Physical Exam Constitutional:      General: He is not in acute distress.    Appearance: He is well-developed. He is obese. He is not diaphoretic.  HENT:     Head: Normocephalic and atraumatic.     Right Ear: External ear normal.     Left Ear: External ear normal.     Nose: Nose normal.     Mouth/Throat:     Pharynx: No oropharyngeal exudate.  Eyes:     General: No scleral icterus.       Right eye: No discharge.        Left eye: No discharge.     Conjunctiva/sclera: Conjunctivae normal.     Pupils: Pupils are equal, round, and reactive to light.  Neck:     Thyroid: No thyromegaly.     Vascular: No JVD.     Trachea: No tracheal deviation.  Cardiovascular:     Rate and Rhythm: Normal rate and regular rhythm.     Heart sounds: Normal heart sounds. No murmur heard.    No friction rub. No gallop.  Pulmonary:     Effort: Pulmonary effort is normal. No respiratory distress.     Breath sounds: Normal breath sounds. No wheezing or rales.  Chest:     Chest wall: No tenderness.  Abdominal:     General: Bowel sounds are normal. There is no distension.     Palpations: Abdomen is soft. There is no mass.     Tenderness: There is no abdominal tenderness. There is no guarding or rebound.  Genitourinary:    Penis: Normal. No tenderness.      Testes: Normal.     Prostate: Normal.     Rectum: Normal. Guaiac result negative.  Musculoskeletal:        General: No tenderness.  Normal range of motion.     Cervical back: Neck supple.  Lymphadenopathy:     Cervical: No cervical adenopathy.  Skin:    General: Skin is warm and dry.     Coloration: Skin is not pale.     Findings: No erythema or rash.  Neurological:     Mental Status: He is alert and oriented to person, place, and time.     Cranial Nerves: No cranial nerve deficit.     Motor: No abnormal muscle tone.     Coordination: Coordination normal.     Deep Tendon Reflexes: Reflexes are normal and symmetric. Reflexes normal.  Psychiatric:        Behavior: Behavior normal.        Thought Content: Thought content normal.        Judgment: Judgment normal.           Assessment & Plan:  Well exam. We discussed diet and exercise. Get fasting labs. Alysia Penna, MD

## 2022-05-14 ENCOUNTER — Other Ambulatory Visit: Payer: Self-pay | Admitting: Family Medicine

## 2022-06-17 ENCOUNTER — Encounter: Payer: Self-pay | Admitting: Internal Medicine

## 2022-06-17 ENCOUNTER — Ambulatory Visit: Payer: BC Managed Care – PPO | Admitting: Internal Medicine

## 2022-06-17 VITALS — BP 145/90 | HR 87 | Temp 98.1°F | Wt 307.6 lb

## 2022-06-17 DIAGNOSIS — K649 Unspecified hemorrhoids: Secondary | ICD-10-CM | POA: Diagnosis not present

## 2022-06-17 MED ORDER — HYDROCORTISONE ACETATE 25 MG RE SUPP
25.0000 mg | Freq: Two times a day (BID) | RECTAL | 0 refills | Status: AC
Start: 2022-06-17 — End: 2022-06-27

## 2022-06-17 NOTE — Progress Notes (Signed)
Established Patient Office Visit     CC/Reason for Visit: Anal pain  HPI: Samuel Harrell is a 47 y.o. male who is coming in today for the above mentioned reasons.  For the past few days has noticed pain on the right side of his anal verge.  No anal fissure, no bleeding.  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  No recent constipation or straining.   Past Medical/Surgical History: Past Medical History:  Diagnosis Date   Anxiety    Bipolar 1 disorder (HCC)    Chronic diarrhea    from IBS, sees Dr. Marina Goodell   Chronic pain of toe of left foot    sees Dr. Coral Else    Headache(784.0)    Herpes    Hypertension     Past Surgical History:  Procedure Laterality Date   COLONOSCOPY  02/12/2016   per Dr. Marina Goodell, clear, repeat in 10 yrs    HAMMER TOE SURGERY Left    left 2nd toe, per Dr. Toni Arthurs    WISDOM TOOTH EXTRACTION      Social History:  reports that he has never smoked. He has never used smokeless tobacco. He reports current alcohol use. He reports that he does not use drugs.  Allergies: Allergies  Allergen Reactions   Lisinopril Cough    Family History:  Family History  Problem Relation Age of Onset   Arthritis Other        family hx   Colitis Mother    Colon cancer Neg Hx    Esophageal cancer Neg Hx    Pancreatic cancer Neg Hx    Prostate cancer Neg Hx    Rectal cancer Neg Hx    Stomach cancer Neg Hx      Current Outpatient Medications:    colestipol (COLESTID) 1 g tablet, TAKE 1 TABLET BY MOUTH 2 TIMES DAILY., Disp: 180 tablet, Rfl: 0   hydrocortisone (ANUSOL-HC) 25 MG suppository, Place 1 suppository (25 mg total) rectally 2 (two) times daily for 10 days., Disp: 20 suppository, Rfl: 0   losartan (COZAAR) 50 MG tablet, TAKE 1 TABLET BY MOUTH EVERY DAY, Disp: 90 tablet, Rfl: 0   MELATONIN PO, Take by mouth at bedtime., Disp: , Rfl:    naproxen (NAPROSYN) 500 MG tablet, Take 1 tablet (500 mg total) by  mouth 2 (two) times daily as needed for mild pain., Disp: 180 tablet, Rfl: 3  Review of Systems:  Negative unless indicated in HPI.   Physical Exam: Vitals:   06/17/22 1255 06/17/22 1257  BP: (!) 140/90 (!) 145/90  Pulse: 87   Temp: 98.1 F (36.7 C)   TempSrc: Oral   SpO2: 99%   Weight: (!) 307 lb 9.6 oz (139.5 kg)     Body mass index is 37.44 kg/m.   Physical Exam Vitals reviewed.  Constitutional:      Appearance: Normal appearance.  HENT:     Head: Normocephalic and atraumatic.  Eyes:     Conjunctiva/sclera: Conjunctivae normal.     Pupils: Pupils are equal, round, and reactive to light.  Skin:    General: Skin is warm and dry.  Neurological:     General: No focal deficit present.     Mental Status: He is alert and oriented to person, place, and time.  Psychiatric:        Mood and Affect: Mood normal.        Behavior: Behavior  normal.        Thought Content: Thought content normal.        Judgment: Judgment normal.      Impression and Plan:  Hemorrhoids, unspecified hemorrhoid type -     Hydrocortisone Acetate; Place 1 suppository (25 mg total) rectally 2 (two) times daily for 10 days.  Dispense: 20 suppository; Refill: 0  -Suspect internal hemorrhoid.  Have advised sitz bath's, Anusol suppositories, stool softeners and avoidance of straining.  Have given work restrictions.  He knows to return if symptoms persist and fail to improve.   Time spent:23 minutes reviewing chart, interviewing and examining patient and formulating plan of care.     Chaya Jan, MD  Primary [facility] at [facility]

## 2022-06-18 MED ORDER — HYDROCORTISONE 0.5 % EX CREA: 1.0000 | TOPICAL_CREAM | Freq: Two times a day (BID) | CUTANEOUS | 0 refills | Status: AC

## 2022-06-18 NOTE — Telephone Encounter (Signed)
Spoke to the pharmacist and hydrocortisone suppository is not covered by the patient's insurance.  Please advise.

## 2022-08-10 ENCOUNTER — Other Ambulatory Visit: Payer: Self-pay | Admitting: Family Medicine

## 2022-08-11 ENCOUNTER — Other Ambulatory Visit: Payer: Self-pay | Admitting: Family Medicine

## 2022-08-11 ENCOUNTER — Telehealth: Payer: BC Managed Care – PPO | Admitting: Family Medicine

## 2022-08-11 ENCOUNTER — Encounter: Payer: Self-pay | Admitting: Family Medicine

## 2022-08-11 DIAGNOSIS — J40 Bronchitis, not specified as acute or chronic: Secondary | ICD-10-CM | POA: Diagnosis not present

## 2022-08-11 MED ORDER — BENZONATATE 200 MG PO CAPS: 200.0000 mg | ORAL_CAPSULE | Freq: Four times a day (QID) | ORAL | 0 refills | Status: DC | PRN

## 2022-08-11 MED ORDER — AZITHROMYCIN 250 MG PO TABS: ORAL_TABLET | ORAL | 0 refills | Status: DC

## 2022-08-11 NOTE — Progress Notes (Signed)
Subjective:    Patient ID: Samuel Harrell, male    DOB: 1975-11-28, 47 y.o.   MRN: [id number]  HPI Virtual Visit via Video Note  I connected with the patient on 08/11/22 at  1:45 PM EDT by a video enabled telemedicine application and verified that I am speaking with the correct person using two identifiers.  Location patient: home Location provider:work or home office Persons participating in the virtual visit: patient, provider  I discussed the limitations of evaluation and management by telemedicine and the availability of in person appointments. The patient expressed understanding and agreed to proceed.   HPI: Here for 5 days of stuffy head, PND, ST, and coughing up yellow sputum. No fever or SOB. He tested negative for Covid last night.    ROS: See pertinent positives and negatives per HPI.  Past Medical History:  Diagnosis Date   Anxiety    Bipolar 1 disorder (HCC)    Chronic diarrhea    from IBS, sees Dr. Marina Goodell   Chronic pain of toe of left foot    sees Dr. Coral Else    Headache(784.0)    Herpes    Hypertension     Past Surgical History:  Procedure Laterality Date   COLONOSCOPY  02/12/2016   per Dr. Marina Goodell, clear, repeat in 10 yrs    HAMMER TOE SURGERY Left    left 2nd toe, per Dr. Toni Arthurs    WISDOM TOOTH EXTRACTION      Family History  Problem Relation Age of Onset   Arthritis Other        family hx   Colitis Mother    Colon cancer Neg Hx    Esophageal cancer Neg Hx    Pancreatic cancer Neg Hx    Prostate cancer Neg Hx    Rectal cancer Neg Hx    Stomach cancer Neg Hx      Current Outpatient Medications:    colestipol (COLESTID) 1 g tablet, TAKE 1 TABLET BY MOUTH 2 TIMES DAILY., Disp: 180 tablet, Rfl: 0   hydrocortisone cream 0.5 %, Apply 1 Application topically 2 (two) times daily., Disp: 30 g, Rfl: 0   losartan (COZAAR) 50 MG tablet, TAKE 1 TABLET BY MOUTH EVERY DAY, Disp: 90 tablet, Rfl: 0   MELATONIN PO, Take by mouth at bedtime., Disp:  , Rfl:    naproxen (NAPROSYN) 500 MG tablet, Take 1 tablet (500 mg total) by mouth 2 (two) times daily as needed for mild pain., Disp: 180 tablet, Rfl: 3   predniSONE (DELTASONE) 10 MG tablet, Take by mouth. 3 days of 30, 2 days of 20, 1 day of 10., Disp: , Rfl:   EXAM:  VITALS per patient if applicable:  GENERAL: alert, oriented, appears well and in no acute distress  HEENT: atraumatic, conjunttiva clear, no obvious abnormalities on inspection of external nose and ears  NECK: normal movements of the head and neck  LUNGS: on inspection no signs of respiratory distress, breathing rate appears normal, no obvious gross SOB, gasping or wheezing  CV: no obvious cyanosis  MS: moves all visible extremities without noticeable abnormality  PSYCH/NEURO: pleasant and cooperative, no obvious depression or anxiety, speech and thought processing grossly intact  ASSESSMENT AND PLAN: Bronchitis, treat with a Zpack. Add Benzonatate for the cough. Gershon Crane, MD  Discussed the following assessment and plan:  No diagnosis found.     I discussed the assessment and treatment plan with the patient. The patient was provided an opportunity to  ask questions and all were answered. The patient agreed with the plan and demonstrated an understanding of the instructions.   The patient was advised to call back or seek an in-person evaluation if the symptoms worsen or if the condition fails to improve as anticipated.      Review of Systems     Objective:   Physical Exam        Assessment & Plan:

## 2022-08-12 MED ORDER — LOSARTAN POTASSIUM 50 MG PO TABS: 50.0000 mg | ORAL_TABLET | Freq: Every day | ORAL | 0 refills | Status: DC

## 2022-08-12 MED ORDER — COLESTIPOL HCL 1 G PO TABS: 1.0000 g | ORAL_TABLET | Freq: Two times a day (BID) | ORAL | 0 refills | Status: DC

## 2022-08-13 ENCOUNTER — Encounter: Payer: Self-pay | Admitting: Family Medicine

## 2022-08-13 MED ORDER — ALBUTEROL SULFATE HFA 108 (90 BASE) MCG/ACT IN AERS: 2.0000 | INHALATION_SPRAY | RESPIRATORY_TRACT | 2 refills | Status: AC | PRN

## 2022-08-13 NOTE — Telephone Encounter (Signed)
Yes it would help. Call in an Albuterol HFA inhaler to take 2 puffs every 4 hours as needed for wheezing or SOB, #1 with 2 rf

## 2022-10-20 ENCOUNTER — Encounter: Payer: Self-pay | Admitting: Family Medicine

## 2022-10-20 ENCOUNTER — Ambulatory Visit: Payer: BC Managed Care – PPO | Admitting: Family Medicine

## 2022-10-20 VITALS — BP 128/82 | HR 72 | Temp 97.9°F | Wt 313.0 lb

## 2022-10-20 DIAGNOSIS — F411 Generalized anxiety disorder: Secondary | ICD-10-CM | POA: Insufficient documentation

## 2022-10-20 MED ORDER — ESCITALOPRAM OXALATE 10 MG PO TABS
10.0000 mg | ORAL_TABLET | Freq: Every day | ORAL | 2 refills | Status: DC
Start: 2022-10-20 — End: 2022-11-12

## 2022-10-20 NOTE — Progress Notes (Signed)
   Subjective:    Patient ID: Samuel Harrell, male    DOB: 1975/01/26, 47 y.o.   MRN: [id number]  HPI Here with his wife to discuss anxiety. He says this has been a problem for him for years, but it has become worse in the past year. A big source of his anxiety is his job, and he worries about this all the time. He also worries about any health issue he has, which right now is plantar fasciitis. He often has trouble sleeping so he has tried melatonin. Their daughter deals with a lot of anxiety, and she has been treated with Lexapro. This has wokred very well for her. His wife says he can also be irritable, but they both deny any depression symptoms.    Review of Systems  Constitutional: Negative.   Cardiovascular: Negative.   Psychiatric/Behavioral:  Positive for sleep disturbance. Negative for agitation, behavioral problems, confusion, decreased concentration, dysphoric mood and hallucinations. The patient is nervous/anxious.        Objective:   Physical Exam Constitutional:      Appearance: Normal appearance.  Cardiovascular:     Rate and Rhythm: Normal rate and regular rhythm.     Pulses: Normal pulses.     Heart sounds: Normal heart sounds.  Pulmonary:     Effort: Pulmonary effort is normal.     Breath sounds: Normal breath sounds.  Neurological:     Mental Status: He is alert.  Psychiatric:        Mood and Affect: Mood normal.        Behavior: Behavior normal.        Thought Content: Thought content normal.           Assessment & Plan:  Anxiety, we will start him on Lexapro 10 mg daily. Follow up in 3 weeks. Gershon Crane, MD

## 2022-11-07 ENCOUNTER — Other Ambulatory Visit: Payer: Self-pay | Admitting: Family Medicine

## 2022-11-10 ENCOUNTER — Ambulatory Visit: Payer: BC Managed Care – PPO | Admitting: Family Medicine

## 2022-11-12 ENCOUNTER — Encounter: Payer: Self-pay | Admitting: Family Medicine

## 2022-11-12 ENCOUNTER — Ambulatory Visit: Payer: BC Managed Care – PPO | Admitting: Family Medicine

## 2022-11-12 VITALS — BP 124/76 | HR 78 | Temp 98.1°F | Wt 311.0 lb

## 2022-11-12 DIAGNOSIS — Z23 Encounter for immunization: Secondary | ICD-10-CM

## 2022-11-12 DIAGNOSIS — F411 Generalized anxiety disorder: Secondary | ICD-10-CM | POA: Diagnosis not present

## 2022-11-12 MED ORDER — ESCITALOPRAM OXALATE 10 MG PO TABS: 10.0000 mg | ORAL_TABLET | Freq: Every day | ORAL | 3 refills | Status: DC

## 2022-11-12 NOTE — Addendum Note (Signed)
Addended by: Carola Rhine on: 11/12/2022 04:42 PM   Modules accepted: Orders

## 2022-11-12 NOTE — Progress Notes (Signed)
   Subjective:    Patient ID: Samuel Harrell, male    DOB: September 07, 1975, 47 y.o.   MRN: [id number]  HPI Here to follow up on anxiety. Two weeks ago he started taking Lexapro 10 mg daily, and he has had excellent results. He feels great, and the anxiety no longer bothers him. He is also sleeping better.    Review of Systems  Constitutional: Negative.   Respiratory: Negative.    Cardiovascular: Negative.   Psychiatric/Behavioral: Negative.         Objective:   Physical Exam Constitutional:      Appearance: Normal appearance.  Cardiovascular:     Rate and Rhythm: Normal rate and regular rhythm.     Pulses: Normal pulses.     Heart sounds: Normal heart sounds.  Pulmonary:     Effort: Pulmonary effort is normal.     Breath sounds: Normal breath sounds.  Neurological:     Mental Status: He is alert.  Psychiatric:        Mood and Affect: Mood normal.        Behavior: Behavior normal.        Thought Content: Thought content normal.           Assessment & Plan:  His anxiety is now well controlled. We will stay on the current regimen.  Gershon Crane, MD

## 2022-11-21 ENCOUNTER — Other Ambulatory Visit: Payer: Self-pay | Admitting: Medical Genetics

## 2022-11-21 DIAGNOSIS — Z006 Encounter for examination for normal comparison and control in clinical research program: Secondary | ICD-10-CM

## 2023-02-04 ENCOUNTER — Ambulatory Visit: Payer: BC Managed Care – PPO | Admitting: Family Medicine

## 2023-02-04 ENCOUNTER — Encounter: Payer: Self-pay | Admitting: Family Medicine

## 2023-02-04 VITALS — BP 132/80 | HR 79 | Temp 98.1°F | Ht 76.0 in | Wt 315.0 lb

## 2023-02-04 DIAGNOSIS — Z0000 Encounter for general adult medical examination without abnormal findings: Secondary | ICD-10-CM

## 2023-02-04 LAB — HEPATIC FUNCTION PANEL
ALT: 20 U/L (ref 0–53)
AST: 17 U/L (ref 0–37)
Albumin: 4.8 g/dL (ref 3.5–5.2)
Alkaline Phosphatase: 69 U/L (ref 39–117)
Bilirubin, Direct: 0.1 mg/dL (ref 0.0–0.3)
Total Bilirubin: 0.5 mg/dL (ref 0.2–1.2)
Total Protein: 7.5 g/dL (ref 6.0–8.3)

## 2023-02-04 LAB — TSH: TSH: 2.65 u[IU]/mL (ref 0.35–5.50)

## 2023-02-04 LAB — LIPID PANEL
Cholesterol: 176 mg/dL (ref 0–200)
HDL: 44.4 mg/dL (ref >39.00)
LDL Cholesterol: 98 mg/dL (ref 0–99)
NonHDL: 132.08
Total CHOL/HDL Ratio: 4
Triglycerides: 172 mg/dL — ABNORMAL HIGH (ref 0.0–149.0)
VLDL: 34.4 mg/dL (ref 0.0–40.0)

## 2023-02-04 LAB — BASIC METABOLIC PANEL
BUN: 13 mg/dL (ref 6–23)
CO2: 29 meq/L (ref 19–32)
Calcium: 9.9 mg/dL (ref 8.4–10.5)
Chloride: 100 meq/L (ref 96–112)
Creatinine, Ser: 1.02 mg/dL (ref 0.40–1.50)
GFR: 86.32 mL/min (ref >60.00)
Glucose, Bld: 94 mg/dL (ref 70–99)
Potassium: 4.6 meq/L (ref 3.5–5.1)
Sodium: 137 meq/L (ref 135–145)

## 2023-02-04 LAB — CBC WITH DIFFERENTIAL/PLATELET
Basophils Absolute: 0 10*3/uL (ref 0.0–0.1)
Basophils Relative: 0.4 % (ref 0.0–3.0)
Eosinophils Absolute: 0.1 10*3/uL (ref 0.0–0.7)
Eosinophils Relative: 1.6 % (ref 0.0–5.0)
HCT: 44.8 % (ref 39.0–52.0)
Hemoglobin: 15 g/dL (ref 13.0–17.0)
Lymphocytes Relative: 28.3 % (ref 12.0–46.0)
Lymphs Abs: 2.1 10*3/uL (ref 0.7–4.0)
MCHC: 33.6 g/dL (ref 30.0–36.0)
MCV: 84.8 fL (ref 78.0–100.0)
Monocytes Absolute: 0.4 10*3/uL (ref 0.1–1.0)
Monocytes Relative: 5.9 % (ref 3.0–12.0)
Neutro Abs: 4.6 10*3/uL (ref 1.4–7.7)
Neutrophils Relative %: 63.8 % (ref 43.0–77.0)
Platelets: 342 10*3/uL (ref 150.0–400.0)
RBC: 5.29 Mil/uL (ref 4.22–5.81)
RDW: 13.7 % (ref 11.5–15.5)
WBC: 7.3 10*3/uL (ref 4.0–10.5)

## 2023-02-04 LAB — HEMOGLOBIN A1C: Hgb A1c MFr Bld: 6.2 % (ref 4.6–6.5)

## 2023-02-04 MED ORDER — LOSARTAN POTASSIUM 50 MG PO TABS: 50.0000 mg | ORAL_TABLET | Freq: Every day | ORAL | 3 refills | Status: DC

## 2023-02-04 MED ORDER — COLESTIPOL HCL 1 G PO TABS: 1.0000 g | ORAL_TABLET | Freq: Two times a day (BID) | ORAL | 3 refills | Status: DC

## 2023-02-04 NOTE — Progress Notes (Signed)
   Subjective:    Patient ID: Samuel Harrell, male    DOB: 1975-03-03, 48 y.o.   MRN: [id number]  HPI Here for a well exam. He is doing well.    Review of Systems  Constitutional: Negative.   HENT: Negative.    Eyes: Negative.   Respiratory: Negative.    Cardiovascular: Negative.   Gastrointestinal: Negative.   Genitourinary: Negative.   Musculoskeletal: Negative.   Skin: Negative.   Neurological: Negative.   Psychiatric/Behavioral: Negative.         Objective:   Physical Exam Constitutional:      General: He is not in acute distress.    Appearance: Normal appearance. He is well-developed. He is not diaphoretic.  HENT:     Head: Normocephalic and atraumatic.     Right Ear: External ear normal.     Left Ear: External ear normal.     Nose: Nose normal.     Mouth/Throat:     Pharynx: No oropharyngeal exudate.  Eyes:     General: No scleral icterus.       Right eye: No discharge.        Left eye: No discharge.     Conjunctiva/sclera: Conjunctivae normal.     Pupils: Pupils are equal, round, and reactive to light.  Neck:     Thyroid: No thyromegaly.     Vascular: No JVD.     Trachea: No tracheal deviation.  Cardiovascular:     Rate and Rhythm: Normal rate and regular rhythm.     Pulses: Normal pulses.     Heart sounds: Normal heart sounds. No murmur heard.    No friction rub. No gallop.  Pulmonary:     Effort: Pulmonary effort is normal. No respiratory distress.     Breath sounds: Normal breath sounds. No wheezing or rales.  Chest:     Chest wall: No tenderness.  Abdominal:     General: Bowel sounds are normal. There is no distension.     Palpations: Abdomen is soft. There is no mass.     Tenderness: There is no abdominal tenderness. There is no guarding or rebound.  Genitourinary:    Penis: Normal. No tenderness.      Testes: Normal.  Musculoskeletal:        General: No tenderness. Normal range of motion.     Cervical back: Neck supple.  Lymphadenopathy:      Cervical: No cervical adenopathy.  Skin:    General: Skin is warm and dry.     Coloration: Skin is not pale.     Findings: No erythema or rash.  Neurological:     General: No focal deficit present.     Mental Status: He is alert and oriented to person, place, and time.     Cranial Nerves: No cranial nerve deficit.     Motor: No abnormal muscle tone.     Coordination: Coordination normal.     Deep Tendon Reflexes: Reflexes are normal and symmetric. Reflexes normal.  Psychiatric:        Mood and Affect: Mood normal.        Behavior: Behavior normal.        Thought Content: Thought content normal.        Judgment: Judgment normal.           Assessment & Plan:  Well exam. We discussed diet and exercise. Get fasting labs. Gershon Crane, MD

## 2023-02-06 ENCOUNTER — Encounter: Payer: Self-pay | Admitting: Family Medicine

## 2023-02-10 NOTE — Telephone Encounter (Signed)
Spoke with pt pharmacy regarding pt request, state that they have quantity of 90 tablets. Advised to fill Rx for 90 tablets as written by PCP. Pt notified

## 2023-04-06 ENCOUNTER — Other Ambulatory Visit (HOSPITAL_COMMUNITY)
Admission: RE | Admit: 2023-04-06 | Discharge: 2023-04-06 | Disposition: A | Discharge status: Home / Self Care | Payer: Self-pay | Source: Ambulatory Visit | Attending: Oncology | Admitting: Oncology

## 2023-04-06 DIAGNOSIS — Z006 Encounter for examination for normal comparison and control in clinical research program: Secondary | ICD-10-CM | POA: Insufficient documentation

## 2023-04-14 ENCOUNTER — Encounter: Payer: Self-pay | Admitting: Family Medicine

## 2023-04-17 ENCOUNTER — Other Ambulatory Visit: Payer: Self-pay

## 2023-04-17 LAB — GENECONNECT MOLECULAR SCREEN: Genetic Analysis Overall Interpretation: NEGATIVE

## 2023-04-17 MED ORDER — COLESTIPOL HCL 1 G PO TABS: 1.0000 g | ORAL_TABLET | Freq: Two times a day (BID) | ORAL | 0 refills | Status: DC

## 2023-04-17 NOTE — Telephone Encounter (Signed)
 FYI Spoke with pt state that on his 1 st refill he was only given 120 and not 180 tablets as prescribed, pt states that after his last refill he will need a 60 tablets to cover what pharmacy did not fill. Pt is ok for now.

## 2023-04-17 NOTE — Telephone Encounter (Signed)
 Rx sent and pt notified.

## 2023-04-17 NOTE — Telephone Encounter (Signed)
 Please call in 60 tabs

## 2023-11-24 ENCOUNTER — Encounter: Payer: Self-pay | Admitting: Family Medicine

## 2023-11-25 MED ORDER — ESCITALOPRAM OXALATE 10 MG PO TABS: 10.0000 mg | ORAL_TABLET | Freq: Every day | ORAL | 0 refills | Status: DC

## 2023-11-29 ENCOUNTER — Other Ambulatory Visit: Payer: Self-pay | Admitting: Family Medicine

## 2024-02-05 ENCOUNTER — Ambulatory Visit: Admitting: Family Medicine

## 2024-02-05 ENCOUNTER — Encounter: Payer: Self-pay | Admitting: Family Medicine

## 2024-02-05 VITALS — BP 130/80 | HR 65 | Temp 98.4°F | Ht 77.0 in | Wt 325.0 lb

## 2024-02-05 DIAGNOSIS — G4730 Sleep apnea, unspecified: Secondary | ICD-10-CM

## 2024-02-05 DIAGNOSIS — Z0000 Encounter for general adult medical examination without abnormal findings: Secondary | ICD-10-CM | POA: Diagnosis not present

## 2024-02-05 DIAGNOSIS — K529 Noninfective gastroenteritis and colitis, unspecified: Secondary | ICD-10-CM

## 2024-02-05 DIAGNOSIS — Z23 Encounter for immunization: Secondary | ICD-10-CM | POA: Diagnosis not present

## 2024-02-05 LAB — CBC WITH DIFFERENTIAL/PLATELET
Basophils Absolute: 0 K/uL (ref 0.0–0.1)
Basophils Relative: 0.6 % (ref 0.0–3.0)
Eosinophils Absolute: 0.1 K/uL (ref 0.0–0.7)
Eosinophils Relative: 1.6 % (ref 0.0–5.0)
HCT: 43.6 % (ref 39.0–52.0)
Hemoglobin: 14.9 g/dL (ref 13.0–17.0)
Lymphocytes Relative: 28.7 % (ref 12.0–46.0)
Lymphs Abs: 2 K/uL (ref 0.7–4.0)
MCHC: 34.2 g/dL (ref 30.0–36.0)
MCV: 84 fl (ref 78.0–100.0)
Monocytes Absolute: 0.5 K/uL (ref 0.1–1.0)
Monocytes Relative: 6.8 % (ref 3.0–12.0)
Neutro Abs: 4.2 K/uL (ref 1.4–7.7)
Neutrophils Relative %: 62.3 % (ref 43.0–77.0)
Platelets: 322 K/uL (ref 150.0–400.0)
RBC: 5.2 Mil/uL (ref 4.22–5.81)
RDW: 13.4 % (ref 11.5–15.5)
WBC: 6.8 K/uL (ref 4.0–10.5)

## 2024-02-05 LAB — BASIC METABOLIC PANEL WITH GFR
BUN: 15 mg/dL (ref 6–23)
CO2: 28 meq/L (ref 19–32)
Calcium: 9.8 mg/dL (ref 8.4–10.5)
Chloride: 101 meq/L (ref 96–112)
Creatinine, Ser: 0.95 mg/dL (ref 0.40–1.50)
GFR: 93.34 mL/min
Glucose, Bld: 92 mg/dL (ref 70–99)
Potassium: 4.8 meq/L (ref 3.5–5.1)
Sodium: 138 meq/L (ref 135–145)

## 2024-02-05 LAB — HEPATIC FUNCTION PANEL
ALT: 29 U/L (ref 3–53)
AST: 20 U/L (ref 5–37)
Albumin: 4.8 g/dL (ref 3.5–5.2)
Alkaline Phosphatase: 72 U/L (ref 39–117)
Bilirubin, Direct: 0.1 mg/dL (ref 0.1–0.3)
Total Bilirubin: 0.6 mg/dL (ref 0.2–1.2)
Total Protein: 7.6 g/dL (ref 6.0–8.3)

## 2024-02-05 LAB — LIPID PANEL
Cholesterol: 183 mg/dL (ref 28–200)
HDL: 44.7 mg/dL
LDL Cholesterol: 100 mg/dL — ABNORMAL HIGH (ref 10–99)
NonHDL: 138.43
Total CHOL/HDL Ratio: 4
Triglycerides: 191 mg/dL — ABNORMAL HIGH (ref 10.0–149.0)
VLDL: 38.2 mg/dL (ref 0.0–40.0)

## 2024-02-05 LAB — PSA: PSA: 1.05 ng/mL (ref 0.10–4.00)

## 2024-02-05 LAB — HEMOGLOBIN A1C: Hgb A1c MFr Bld: 6.2 % (ref 4.6–6.5)

## 2024-02-05 LAB — TSH: TSH: 2.44 m[IU]/L (ref 0.40–4.50)

## 2024-02-05 MED ORDER — COLESTIPOL HCL 1 G PO TABS: 1.0000 g | ORAL_TABLET | Freq: Two times a day (BID) | ORAL | 3 refills | Status: AC

## 2024-02-05 MED ORDER — LOSARTAN POTASSIUM 50 MG PO TABS: 50.0000 mg | ORAL_TABLET | Freq: Every day | ORAL | 3 refills | Status: AC

## 2024-02-05 MED ORDER — ESCITALOPRAM OXALATE 10 MG PO TABS: 10.0000 mg | ORAL_TABLET | Freq: Every day | ORAL | 3 refills | Status: AC

## 2024-02-05 NOTE — Addendum Note (Signed)
 Addended by: LADONNA INOCENTE SAILOR on: 02/05/2024 10:55 AM   Modules accepted: Orders

## 2024-02-05 NOTE — Progress Notes (Signed)
 "  Subjective:    Patient ID: Samuel Harrell, male    DOB: 05/01/1975, 49 y.o.   MRN: [id number]  HPI Here for a well exam. He feels good in general. His stools are still managed well bu the Colestipol . He asks about a sleep study, since his wife says he snores a lot. Interestingly his son was diagnosed with sleep apnea at the age of 5 years due to a narrow oropharynx, and he has been using CPAP ever since    Review of Systems  Constitutional: Negative.   HENT: Negative.    Eyes: Negative.   Respiratory: Negative.    Cardiovascular: Negative.   Gastrointestinal: Negative.   Genitourinary: Negative.   Musculoskeletal: Negative.   Skin: Negative.   Neurological: Negative.   Psychiatric/Behavioral: Negative.         Objective:   Physical Exam Constitutional:      General: He is not in acute distress.    Appearance: He is well-developed. He is obese. He is not diaphoretic.  HENT:     Head: Normocephalic and atraumatic.     Right Ear: External ear normal.     Left Ear: External ear normal.     Nose: Nose normal.     Mouth/Throat:     Pharynx: No oropharyngeal exudate.  Eyes:     General: No scleral icterus.       Right eye: No discharge.        Left eye: No discharge.     Conjunctiva/sclera: Conjunctivae normal.     Pupils: Pupils are equal, round, and reactive to light.  Neck:     Thyroid : No thyromegaly.     Vascular: No JVD.     Trachea: No tracheal deviation.  Cardiovascular:     Rate and Rhythm: Normal rate and regular rhythm.     Pulses: Normal pulses.     Heart sounds: Normal heart sounds. No murmur heard.    No friction rub. No gallop.  Pulmonary:     Effort: Pulmonary effort is normal. No respiratory distress.     Breath sounds: Normal breath sounds. No wheezing or rales.  Chest:     Chest wall: No tenderness.  Abdominal:     General: Bowel sounds are normal. There is no distension.     Palpations: Abdomen is soft. There is no mass.     Tenderness:  There is no abdominal tenderness. There is no guarding or rebound.  Genitourinary:    Penis: Normal. No tenderness.      Testes: Normal.     Prostate: Normal.     Rectum: Normal. Guaiac result negative.  Musculoskeletal:        General: No tenderness. Normal range of motion.     Cervical back: Neck supple.  Lymphadenopathy:     Cervical: No cervical adenopathy.  Skin:    General: Skin is warm and dry.     Coloration: Skin is not pale.     Findings: No erythema or rash.  Neurological:     General: No focal deficit present.     Mental Status: He is alert and oriented to person, place, and time.     Cranial Nerves: No cranial nerve deficit.     Motor: No abnormal muscle tone.     Coordination: Coordination normal.     Deep Tendon Reflexes: Reflexes are normal and symmetric. Reflexes normal.  Psychiatric:        Mood and Affect: Mood normal.  Behavior: Behavior normal.        Thought Content: Thought content normal.        Judgment: Judgment normal.           Assessment & Plan:  Well exam. We discussed diet and exercise. Get fasting labs. Refer to Pulmonology for a sleep evaluation.  Garnette Olmsted, MD    "

## 2024-02-09 ENCOUNTER — Ambulatory Visit: Payer: Self-pay | Admitting: Family Medicine
